# Patient Record
Sex: Male | Born: 1955 | Race: White | Hispanic: No | Marital: Single | Smoking: Current every day smoker
Health system: Southern US, Community
[De-identification: ages and names within clinical notes are randomized; demographics above are authoritative.]

## PROBLEM LIST (undated history)

## (undated) DIAGNOSIS — M1990 Unspecified osteoarthritis, unspecified site: Secondary | ICD-10-CM

## (undated) DIAGNOSIS — E785 Hyperlipidemia, unspecified: Secondary | ICD-10-CM

## (undated) DIAGNOSIS — R0600 Dyspnea, unspecified: Secondary | ICD-10-CM

## (undated) DIAGNOSIS — J449 Chronic obstructive pulmonary disease, unspecified: Secondary | ICD-10-CM

## (undated) DIAGNOSIS — R7303 Prediabetes: Secondary | ICD-10-CM

## (undated) DIAGNOSIS — J189 Pneumonia, unspecified organism: Secondary | ICD-10-CM

## (undated) HISTORY — PX: NO PAST SURGERIES: SHX2092

## (undated) HISTORY — PX: WISDOM TOOTH EXTRACTION: SHX21

---

## 2013-01-19 ENCOUNTER — Emergency Department: Payer: Self-pay | Admitting: Emergency Medicine

## 2015-03-16 ENCOUNTER — Other Ambulatory Visit: Payer: Self-pay

## 2015-03-23 ENCOUNTER — Other Ambulatory Visit: Payer: Self-pay

## 2015-03-23 ENCOUNTER — Ambulatory Visit: Payer: Self-pay

## 2015-03-28 ENCOUNTER — Other Ambulatory Visit: Payer: Self-pay

## 2015-03-30 ENCOUNTER — Other Ambulatory Visit: Payer: Self-pay

## 2015-03-30 ENCOUNTER — Ambulatory Visit: Payer: Self-pay

## 2015-04-06 ENCOUNTER — Ambulatory Visit: Payer: Self-pay

## 2015-04-06 DIAGNOSIS — E785 Hyperlipidemia, unspecified: Secondary | ICD-10-CM | POA: Insufficient documentation

## 2015-04-06 DIAGNOSIS — S63289A Dislocation of proximal interphalangeal joint of unspecified finger, initial encounter: Secondary | ICD-10-CM | POA: Insufficient documentation

## 2015-09-06 DIAGNOSIS — S63289A Dislocation of proximal interphalangeal joint of unspecified finger, initial encounter: Secondary | ICD-10-CM

## 2015-09-06 DIAGNOSIS — E785 Hyperlipidemia, unspecified: Secondary | ICD-10-CM

## 2015-10-05 ENCOUNTER — Ambulatory Visit: Payer: Self-pay

## 2015-10-17 ENCOUNTER — Ambulatory Visit: Payer: Self-pay | Admitting: Nurse Practitioner

## 2015-10-17 ENCOUNTER — Telehealth: Payer: Self-pay

## 2015-10-17 VITALS — BP 111/70 | HR 80 | Temp 98.7°F | Resp 16 | Ht 72.0 in | Wt 240.0 lb

## 2015-10-17 DIAGNOSIS — M545 Low back pain, unspecified: Secondary | ICD-10-CM | POA: Insufficient documentation

## 2015-10-17 DIAGNOSIS — Z8639 Personal history of other endocrine, nutritional and metabolic disease: Secondary | ICD-10-CM

## 2015-10-17 NOTE — Assessment & Plan Note (Signed)
Prescribed Naproxen 500 mg.

## 2015-10-17 NOTE — Telephone Encounter (Signed)
Mr called to verify appointment time. Confirmed appoint with mr for tonight, Tuesday , March 14 at 7pm

## 2015-10-17 NOTE — Progress Notes (Unsigned)
   Subjective:    Patient ID: Antonio Gonzalez, male    DOB: 1955/12/27, 60 y.o.   MRN: [id number]  HPI  Pt has a broken finger. Pt has already seen the orthopedist at [facility]. Pt was advised to get [facility] at [facility] but pt cannot afford to go down to [facility] and go through that process.   Pt complains of problem with upper R thigh. Pt claims that the area gets very hot and is slightly numb. 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   Pt has back pain. Takes aspirin PRN.    Review of Systems     Objective:   Physical Exam  Constitutional: He is oriented to person, place, and time. He appears well-developed.  HENT:  Head: Normocephalic.  Neck: Thyromegaly (mildly enlarged) present.  Cardiovascular: Normal rate, regular rhythm and normal heart sounds.   Pulmonary/Chest: Respiratory distress: diminsihed as expected in smoker.  Musculoskeletal: He exhibits no edema.  Lymphadenopathy:    He has no cervical adenopathy.  Neurological: He is alert and oriented to person, place, and time.  Dislocated finger at PIP joint     Assessment & Plan:  Low back pain Prescribed Naproxen 500 mg.    Labs ASAP: CBC with diff, CMP, TSH, A1c, lipids,  MD f/u: 2-3 week

## 2015-10-25 ENCOUNTER — Other Ambulatory Visit: Payer: Self-pay | Admitting: Family Medicine

## 2015-10-26 ENCOUNTER — Other Ambulatory Visit: Payer: Self-pay

## 2015-10-26 DIAGNOSIS — E7889 Other lipoprotein metabolism disorders: Secondary | ICD-10-CM

## 2015-10-26 DIAGNOSIS — E7800 Pure hypercholesterolemia, unspecified: Secondary | ICD-10-CM

## 2015-10-26 DIAGNOSIS — E119 Type 2 diabetes mellitus without complications: Secondary | ICD-10-CM

## 2015-10-26 MED ORDER — SIMVASTATIN 20 MG PO TABS: 20.0000 mg | ORAL_TABLET | Freq: Every evening | ORAL | Status: DC

## 2015-10-27 LAB — COMPREHENSIVE METABOLIC PANEL
ALK PHOS: 40 IU/L (ref 39–117)
ALT: 27 IU/L (ref 0–44)
AST: 21 IU/L (ref 0–40)
Albumin/Globulin Ratio: 2 (ref 1.2–2.2)
Albumin: 4.2 g/dL (ref 3.6–4.8)
BILIRUBIN TOTAL: 0.3 mg/dL (ref 0.0–1.2)
BUN / CREAT RATIO: 13 (ref 10–22)
BUN: 12 mg/dL (ref 8–27)
CHLORIDE: 98 mmol/L (ref 96–106)
CO2: 25 mmol/L (ref 18–29)
Calcium: 8.8 mg/dL (ref 8.6–10.2)
Creatinine, Ser: 0.89 mg/dL (ref 0.76–1.27)
GFR calc Af Amer: 107 mL/min/{1.73_m2} (ref >59)
GFR calc non Af Amer: 92 mL/min/{1.73_m2} (ref >59)
GLUCOSE: 89 mg/dL (ref 65–99)
Globulin, Total: 2.1 g/dL (ref 1.5–4.5)
Potassium: 4.4 mmol/L (ref 3.5–5.2)
Sodium: 140 mmol/L (ref 134–144)
Total Protein: 6.3 g/dL (ref 6.0–8.5)

## 2015-10-27 LAB — CBC WITH DIFFERENTIAL/PLATELET
BASOS ABS: 0.1 10*3/uL (ref 0.0–0.2)
Basos: 0 %
EOS (ABSOLUTE): 0.2 10*3/uL (ref 0.0–0.4)
Eos: 2 %
HEMOGLOBIN: 15.4 g/dL (ref 12.6–17.7)
Hematocrit: 45.8 % (ref 37.5–51.0)
Immature Grans (Abs): 0.1 10*3/uL (ref 0.0–0.1)
Immature Granulocytes: 1 %
LYMPHS ABS: 2.3 10*3/uL (ref 0.7–3.1)
LYMPHS: 21 %
MCH: 30.8 pg (ref 26.6–33.0)
MCHC: 33.6 g/dL (ref 31.5–35.7)
MCV: 92 fL (ref 79–97)
MONOCYTES: 8 %
Monocytes Absolute: 0.9 10*3/uL (ref 0.1–0.9)
Neutrophils Absolute: 7.7 10*3/uL — ABNORMAL HIGH (ref 1.4–7.0)
Neutrophils: 68 %
Platelets: 229 10*3/uL (ref 150–379)
RBC: 5 x10E6/uL (ref 4.14–5.80)
RDW: 14.6 % (ref 12.3–15.4)
WBC: 11.3 10*3/uL — AB (ref 3.4–10.8)

## 2015-10-27 LAB — LIPID PANEL
Chol/HDL Ratio: 3.3 ratio units (ref 0.0–5.0)
Cholesterol, Total: 198 mg/dL (ref 100–199)
HDL: 60 mg/dL (ref >39)
LDL Calculated: 113 mg/dL — ABNORMAL HIGH (ref 0–99)
TRIGLYCERIDES: 124 mg/dL (ref 0–149)
VLDL Cholesterol Cal: 25 mg/dL (ref 5–40)

## 2015-11-02 ENCOUNTER — Telehealth: Payer: Self-pay

## 2015-11-02 ENCOUNTER — Ambulatory Visit: Payer: Self-pay

## 2015-11-02 NOTE — Telephone Encounter (Signed)
Mr called to cancel his appointment tonight due to having flu mr resched app to [facility] April 6th

## 2015-11-09 ENCOUNTER — Ambulatory Visit: Payer: Self-pay | Admitting: Nurse Practitioner

## 2015-11-09 DIAGNOSIS — Z87898 Personal history of other specified conditions: Secondary | ICD-10-CM

## 2015-11-09 DIAGNOSIS — R05 Cough: Secondary | ICD-10-CM

## 2015-11-09 DIAGNOSIS — E785 Hyperlipidemia, unspecified: Secondary | ICD-10-CM

## 2015-11-09 DIAGNOSIS — R059 Cough, unspecified: Secondary | ICD-10-CM

## 2015-11-09 DIAGNOSIS — J438 Other emphysema: Secondary | ICD-10-CM

## 2015-11-09 DIAGNOSIS — D72829 Elevated white blood cell count, unspecified: Secondary | ICD-10-CM

## 2015-11-09 MED ORDER — AZITHROMYCIN 250 MG PO TABS: 500.0000 mg | ORAL_TABLET | Freq: Every day | ORAL | Status: AC

## 2015-11-09 NOTE — Progress Notes (Signed)
   Subjective:    Patient ID: Antonio Gonzalez, male    DOB: 1955/11/10, 60 y.o.   MRN: [id number]  HPI   Has had a productive cough over the last two weeks, now smoking < 1/2 ppd.  Has noted small amount of blood tinged sputum. States cough has interfered with his sleep.   Has an inhaler but has not understood when to use it.    Is deferring dental work, until other healthcare needs are met.    Wants prostate check, history of nocturia     Review of Systems  Constitutional: Negative for chills, diaphoresis and unexpected weight change.  HENT: Positive for congestion and dental problem.   Respiratory: Positive for cough and shortness of breath.   Cardiovascular: Negative for palpitations and leg swelling.  Endocrine: Negative for polyphagia.  Genitourinary: Positive for hematuria. Negative for urgency.  Musculoskeletal: Positive for back pain.  Psychiatric/Behavioral: Negative for hallucinations.       Objective:   Physical Exam  Constitutional: He is oriented to person, place, and time. He appears well-developed and well-nourished.  HENT:  Head: Normocephalic.  Severe dental disease  Eyes: Pupils are equal, round, and reactive to light.  Neck: No thyromegaly present.  Carotid bruits not present.  Cardiovascular: Normal rate and regular rhythm.   Pulmonary/Chest: Effort normal. He has wheezes.  Posteriorly, BBrS markedly decreased.    Musculoskeletal: He exhibits no edema.  Lymphadenopathy:    He has no cervical adenopathy.  Neurological: He is alert and oriented to person, place, and time.       Assessment & Plan:    Diagnoses and all orders for this visit:  Cough  Other emphysema ([place])  History of nocturia -     PSA; Future  Leucocytosis -     CBC w/Diff; Future  Elevated fasting lipid profile -     Comp Met (CMET); Future -     Lipid Profile; Future  Other orders -     azithromycin (ZITHROMAX) 250 MG tablet; Take 2 tablets (500 mg total) by mouth  daily.  concern re:  Bronchitis, and small amount of blood in sputum, will treat with ithromax and obtain a cxr

## 2016-01-09 ENCOUNTER — Telehealth: Payer: Self-pay | Admitting: Urology

## 2016-01-09 ENCOUNTER — Other Ambulatory Visit: Payer: Self-pay | Admitting: Urology

## 2016-01-09 MED ORDER — ALBUTEROL SULFATE HFA 108 (90 BASE) MCG/ACT IN AERS: 2.0000 | INHALATION_SPRAY | Freq: Four times a day (QID) | RESPIRATORY_TRACT | Status: DC | PRN

## 2016-01-09 NOTE — Telephone Encounter (Signed)
I sent a script in to [facility] for the inhaler.  He is to have a CXR at some point.

## 2016-01-09 NOTE — Telephone Encounter (Signed)
Patient called and stated that he is trying to get a refill on his Albuterol/sulfate inhaler but that [facility] said the RX had expired according to his records I do not see an RX for that.

## 2016-01-10 NOTE — Telephone Encounter (Signed)
Do you need me to order the Chest XRay?

## 2016-01-31 ENCOUNTER — Encounter (INDEPENDENT_AMBULATORY_CARE_PROVIDER_SITE_OTHER): Payer: Self-pay

## 2016-01-31 ENCOUNTER — Ambulatory Visit: Payer: Self-pay

## 2016-02-08 ENCOUNTER — Ambulatory Visit: Payer: Self-pay

## 2016-02-08 ENCOUNTER — Telehealth: Payer: Self-pay | Admitting: Nurse Practitioner

## 2016-02-08 NOTE — Telephone Encounter (Signed)
Minette Brine called patient rescheduled appt to 02/22/16.

## 2016-02-22 ENCOUNTER — Other Ambulatory Visit: Payer: Self-pay | Admitting: Urology

## 2016-02-22 ENCOUNTER — Ambulatory Visit: Payer: Self-pay | Admitting: Urology

## 2016-02-22 VITALS — BP 116/70 | HR 90 | Temp 98.2°F | Wt 237.0 lb

## 2016-02-22 DIAGNOSIS — J4489 Other specified chronic obstructive pulmonary disease: Secondary | ICD-10-CM

## 2016-02-22 DIAGNOSIS — E7889 Other lipoprotein metabolism disorders: Secondary | ICD-10-CM

## 2016-02-22 DIAGNOSIS — J449 Chronic obstructive pulmonary disease, unspecified: Secondary | ICD-10-CM

## 2016-02-22 MED ORDER — TIOTROPIUM BROMIDE MONOHYDRATE 18 MCG IN CAPS: 18.0000 ug | ORAL_CAPSULE | Freq: Once | RESPIRATORY_TRACT | Status: DC

## 2016-02-22 MED ORDER — SIMVASTATIN 20 MG PO TABS: 20.0000 mg | ORAL_TABLET | Freq: Every evening | ORAL | Status: DC

## 2016-02-22 MED ORDER — NAPROXEN 500 MG PO TABS: 500.0000 mg | ORAL_TABLET | Freq: Every day | ORAL | Status: DC

## 2016-02-22 MED ORDER — ALBUTEROL SULFATE HFA 108 (90 BASE) MCG/ACT IN AERS: 2.0000 | INHALATION_SPRAY | Freq: Four times a day (QID) | RESPIRATORY_TRACT | Status: DC | PRN

## 2016-02-22 NOTE — Telephone Encounter (Signed)
Patient needs to make a FU appointment before we will authorize anymore refills.  This will be the last refill until he sees a provider.

## 2016-02-22 NOTE — Progress Notes (Signed)
   Subjective:    Patient ID: Antonio Gonzalez, male    DOB: [birth year], 60 y.o.   MRN: [id number]  Cough Associated symptoms include shortness of breath. Pertinent negatives include no chills.  Hyperlipidemia Associated symptoms include shortness of breath.  Back Pain     Has been using the albuterol three times a week.  Has not had his chest x-ray.    Review of Systems  Constitutional: Negative for chills, diaphoresis and unexpected weight change.  HENT: Positive for congestion and dental problem.   Respiratory: Positive for cough and shortness of breath.   Cardiovascular: Negative for palpitations and leg swelling.  Endocrine: Negative for polyphagia.  Genitourinary: Positive for hematuria. Negative for urgency.  Musculoskeletal: Positive for back pain.  Psychiatric/Behavioral: Negative for hallucinations.       Objective:   Physical Exam  Constitutional: He is oriented to person, place, and time. He appears well-developed and well-nourished.  HENT:  Head: Normocephalic.  Severe dental disease  Eyes: Pupils are equal, round, and reactive to light.  Neck: No thyromegaly present.  Carotid bruits not present.  Cardiovascular: Normal rate and regular rhythm.   Pulmonary/Chest: Effort normal. He has wheezes.  Posteriorly, BBrS markedly decreased.    Musculoskeletal: He exhibits no edema.  Lymphadenopathy:    He has no cervical adenopathy.  Neurological: He is alert and oriented to person, place, and time.       Assessment & Plan:    Antonio Gonzalez was seen today for cough, hyperlipidemia and back pain.  Diagnoses and all orders for this visit:  Lipids abnormal -     simvastatin (ZOCOR) 20 MG tablet; Take 1 tablet (20 mg total) by mouth every evening.  COPD (chronic obstructive pulmonary disease) with chronic bronchitis ([place]) -     DG Chest 2 View; Future  Other orders -     albuterol (PROVENTIL HFA;VENTOLIN HFA) 108 (90 Base) MCG/ACT inhaler; Inhale 2 puffs into the lungs  every 6 (six) hours as needed for wheezing or shortness of breath. -     naproxen (NAPROSYN) 500 MG tablet; Take 1 tablet (500 mg total) by mouth at bedtime. Sometimes he will take two times a day. -     tiotropium (SPIRIVA HANDIHALER) 18 MCG inhalation capsule; Place 1 capsule (18 mcg total) into inhaler and inhale once.  concern re:  Bronchitis, and small amount of blood in sputum, will treat with ithromax and obtain a cxr

## 2016-02-23 NOTE — Telephone Encounter (Signed)
Refill request

## 2016-03-20 ENCOUNTER — Other Ambulatory Visit: Payer: Self-pay

## 2016-03-21 ENCOUNTER — Other Ambulatory Visit: Payer: Self-pay

## 2016-03-21 DIAGNOSIS — E785 Hyperlipidemia, unspecified: Secondary | ICD-10-CM

## 2016-03-21 DIAGNOSIS — Z87898 Personal history of other specified conditions: Secondary | ICD-10-CM

## 2016-03-21 DIAGNOSIS — D72829 Elevated white blood cell count, unspecified: Secondary | ICD-10-CM

## 2016-03-22 LAB — COMPREHENSIVE METABOLIC PANEL
ALBUMIN: 4.1 g/dL (ref 3.6–4.8)
ALK PHOS: 44 IU/L (ref 39–117)
ALT: 18 IU/L (ref 0–44)
AST: 17 IU/L (ref 0–40)
Albumin/Globulin Ratio: 2 (ref 1.2–2.2)
BUN / CREAT RATIO: 22 (ref 10–24)
BUN: 21 mg/dL (ref 8–27)
Bilirubin Total: <0.2 mg/dL (ref 0.0–1.2)
CALCIUM: 8.9 mg/dL (ref 8.6–10.2)
CO2: 23 mmol/L (ref 18–29)
CREATININE: 0.95 mg/dL (ref 0.76–1.27)
Chloride: 101 mmol/L (ref 96–106)
GFR, EST AFRICAN AMERICAN: 99 mL/min/{1.73_m2} (ref >59)
GFR, EST NON AFRICAN AMERICAN: 86 mL/min/{1.73_m2} (ref >59)
GLOBULIN, TOTAL: 2.1 g/dL (ref 1.5–4.5)
Glucose: 95 mg/dL (ref 65–99)
Potassium: 4.5 mmol/L (ref 3.5–5.2)
SODIUM: 141 mmol/L (ref 134–144)
TOTAL PROTEIN: 6.2 g/dL (ref 6.0–8.5)

## 2016-03-22 LAB — LIPID PANEL
CHOL/HDL RATIO: 3.4 ratio (ref 0.0–5.0)
Cholesterol, Total: 194 mg/dL (ref 100–199)
HDL: 57 mg/dL (ref >39)
LDL CALC: 86 mg/dL (ref 0–99)
Triglycerides: 254 mg/dL — ABNORMAL HIGH (ref 0–149)
VLDL CHOLESTEROL CAL: 51 mg/dL — AB (ref 5–40)

## 2016-03-22 LAB — CBC WITH DIFFERENTIAL/PLATELET
BASOS ABS: 0.1 10*3/uL (ref 0.0–0.2)
BASOS: 1 %
EOS (ABSOLUTE): 0.2 10*3/uL (ref 0.0–0.4)
Eos: 2 %
HEMATOCRIT: 45.4 % (ref 37.5–51.0)
HEMOGLOBIN: 15 g/dL (ref 12.6–17.7)
IMMATURE GRANS (ABS): 0 10*3/uL (ref 0.0–0.1)
IMMATURE GRANULOCYTES: 0 %
LYMPHS: 20 %
Lymphocytes Absolute: 2.1 10*3/uL (ref 0.7–3.1)
MCH: 30.4 pg (ref 26.6–33.0)
MCHC: 33 g/dL (ref 31.5–35.7)
MCV: 92 fL (ref 79–97)
MONOS ABS: 1.1 10*3/uL — AB (ref 0.1–0.9)
Monocytes: 10 %
NEUTROS ABS: 6.8 10*3/uL (ref 1.4–7.0)
NEUTROS PCT: 67 %
Platelets: 213 10*3/uL (ref 150–379)
RBC: 4.93 x10E6/uL (ref 4.14–5.80)
RDW: 14.4 % (ref 12.3–15.4)
WBC: 10.2 10*3/uL (ref 3.4–10.8)

## 2016-03-22 LAB — PSA: PROSTATE SPECIFIC AG, SERUM: 0.7 ng/mL (ref 0.0–4.0)

## 2016-03-26 ENCOUNTER — Other Ambulatory Visit: Payer: Self-pay

## 2016-03-27 ENCOUNTER — Encounter: Payer: Self-pay | Admitting: Internal Medicine

## 2016-03-27 ENCOUNTER — Ambulatory Visit: Payer: Self-pay | Admitting: Internal Medicine

## 2016-03-27 DIAGNOSIS — J449 Chronic obstructive pulmonary disease, unspecified: Secondary | ICD-10-CM | POA: Insufficient documentation

## 2016-03-27 MED ORDER — ALBUTEROL SULFATE HFA 108 (90 BASE) MCG/ACT IN AERS: 2.0000 | INHALATION_SPRAY | Freq: Four times a day (QID) | RESPIRATORY_TRACT | 2 refills | Status: DC | PRN

## 2016-03-27 NOTE — Patient Instructions (Signed)
F/u in 6 months w/ labs: Met C, CBC, Lipid, UA

## 2016-03-27 NOTE — Progress Notes (Signed)
   Subjective:    Patient ID: Antonio Gonzalez, male    DOB: 02/19/1956, 60 y.o.   MRN: [id number]  HPI  Pt. Presents w/ f/u for COPD. Reports he has decreased his smoking to 2 packs a week. Reports takes inhaler when he is wheezing.  History of elevated cholesterol. Pt. Reports irritation in left eye. Reports excessive tearing.   Patient Active Problem List   Diagnosis Date Noted  . Low back pain 10/17/2015  . Hyperlipidemia 04/06/2015  . Dislocation of finger PIP joint 04/06/2015     Medication List       Accurate as of 03/27/16 11:28 AM. Always use your most recent med list.          albuterol 108 (90 Base) MCG/ACT inhaler Commonly known as:  PROVENTIL HFA;VENTOLIN HFA Inhale 2 puffs into the lungs every 6 (six) hours as needed for wheezing or shortness of breath.   naproxen 500 MG tablet Commonly known as:  NAPROSYN Take 1 tablet (500 mg total) by mouth at bedtime. Sometimes he will take two times a day.   simvastatin 20 MG tablet Commonly known as:  ZOCOR Take 1 tablet (20 mg total) by mouth every evening.   tiotropium 18 MCG inhalation capsule Commonly known as:  SPIRIVA HANDIHALER Place 1 capsule (18 mcg total) into inhaler and inhale once.        Review of Systems   Left eye   Objective:   Physical Exam  Constitutional: He is oriented to person, place, and time.  Cardiovascular: Normal rate, regular rhythm and normal heart sounds.   Pulmonary/Chest: Effort normal and breath sounds normal.  Neurological: He is alert and oriented to person, place, and time.     BP 123/79   Pulse 74   Temp 98.3 F (36.8 C)   Wt 234 lb (106.1 kg)   BMI 31.74 kg/m       Assessment & Plan:   F/u in 6 months w/ labs: Met C, CBC, Lipid, UA

## 2016-06-20 ENCOUNTER — Other Ambulatory Visit: Payer: Self-pay

## 2016-06-20 DIAGNOSIS — J449 Chronic obstructive pulmonary disease, unspecified: Secondary | ICD-10-CM

## 2016-06-20 MED ORDER — ALBUTEROL SULFATE HFA 108 (90 BASE) MCG/ACT IN AERS: 2.0000 | INHALATION_SPRAY | Freq: Four times a day (QID) | RESPIRATORY_TRACT | 2 refills | Status: DC | PRN

## 2016-07-02 ENCOUNTER — Telehealth: Payer: Self-pay | Admitting: Urology

## 2016-07-02 NOTE — Telephone Encounter (Signed)
Went to med. Management but RX not there. Rx was brought here and he needs it sent back. Return Antonio Gonzalez.

## 2016-07-03 ENCOUNTER — Telehealth: Payer: Self-pay

## 2016-07-03 NOTE — Telephone Encounter (Signed)
Called pt back about RX that he has not received from medication management. Left message.

## 2016-07-03 NOTE — Telephone Encounter (Signed)
Called pt back and informed him that the inhaler requires a PAP which was signed yesterday and waiting to be picked up. Also explained to pt that it can take 6-8 weeks for the medication to come in. Pt verbalized understanding.

## 2016-09-12 ENCOUNTER — Other Ambulatory Visit: Payer: Self-pay

## 2016-09-17 ENCOUNTER — Other Ambulatory Visit: Payer: Self-pay

## 2016-09-19 ENCOUNTER — Ambulatory Visit: Payer: Self-pay

## 2016-09-19 ENCOUNTER — Other Ambulatory Visit: Payer: Self-pay

## 2016-09-19 DIAGNOSIS — J449 Chronic obstructive pulmonary disease, unspecified: Secondary | ICD-10-CM

## 2016-09-20 LAB — COMPREHENSIVE METABOLIC PANEL
ALBUMIN: 4.3 g/dL (ref 3.6–4.8)
ALK PHOS: 48 IU/L (ref 39–117)
ALT: 23 IU/L (ref 0–44)
AST: 22 IU/L (ref 0–40)
Albumin/Globulin Ratio: 2.4 — ABNORMAL HIGH (ref 1.2–2.2)
BUN / CREAT RATIO: 12 (ref 10–24)
BUN: 12 mg/dL (ref 8–27)
Bilirubin Total: <0.2 mg/dL (ref 0.0–1.2)
CO2: 26 mmol/L (ref 18–29)
CREATININE: 1.03 mg/dL (ref 0.76–1.27)
Calcium: 8.9 mg/dL (ref 8.6–10.2)
Chloride: 100 mmol/L (ref 96–106)
GFR calc Af Amer: 90 mL/min/{1.73_m2} (ref >59)
GFR calc non Af Amer: 77 mL/min/{1.73_m2} (ref >59)
GLUCOSE: 96 mg/dL (ref 65–99)
Globulin, Total: 1.8 g/dL (ref 1.5–4.5)
Potassium: 4.5 mmol/L (ref 3.5–5.2)
Sodium: 141 mmol/L (ref 134–144)
Total Protein: 6.1 g/dL (ref 6.0–8.5)

## 2016-09-20 LAB — URINALYSIS
Bilirubin, UA: NEGATIVE
Glucose, UA: NEGATIVE
KETONES UA: NEGATIVE
LEUKOCYTES UA: NEGATIVE
NITRITE UA: NEGATIVE
PROTEIN UA: NEGATIVE
RBC UA: NEGATIVE
SPEC GRAV UA: 1.019 (ref 1.005–1.030)
Urobilinogen, Ur: 0.2 mg/dL (ref 0.2–1.0)
pH, UA: 5 (ref 5.0–7.5)

## 2016-09-20 LAB — CBC
HEMATOCRIT: 45.3 % (ref 37.5–51.0)
HEMOGLOBIN: 14.7 g/dL (ref 13.0–17.7)
MCH: 29.7 pg (ref 26.6–33.0)
MCHC: 32.5 g/dL (ref 31.5–35.7)
MCV: 92 fL (ref 79–97)
Platelets: 252 10*3/uL (ref 150–379)
RBC: 4.95 x10E6/uL (ref 4.14–5.80)
RDW: 14.1 % (ref 12.3–15.4)
WBC: 11.6 10*3/uL — AB (ref 3.4–10.8)

## 2016-09-20 LAB — LIPID PANEL
CHOL/HDL RATIO: 3.5 ratio (ref 0.0–5.0)
Cholesterol, Total: 202 mg/dL — ABNORMAL HIGH (ref 100–199)
HDL: 58 mg/dL (ref >39)
LDL CALC: 96 mg/dL (ref 0–99)
TRIGLYCERIDES: 242 mg/dL — AB (ref 0–149)
VLDL CHOLESTEROL CAL: 48 mg/dL — AB (ref 5–40)

## 2017-01-02 ENCOUNTER — Telehealth: Payer: Self-pay | Admitting: Pharmacist

## 2017-01-02 ENCOUNTER — Telehealth: Payer: Self-pay

## 2017-01-02 NOTE — Telephone Encounter (Signed)
01/02/17 Faxed script to [place] for refill-Ventolin HFA 90 mcg Inject 2 puffs into the lungs every 6 hours as needed for wheezing or shortness of breath.

## 2017-01-02 NOTE — Telephone Encounter (Signed)
Received PAP application from [facility] for Ventolin placed for provider to sign. 

## 2017-01-02 NOTE — Telephone Encounter (Signed)
Placed signed application/script in [facility] folder for pickup. 

## 2017-03-06 ENCOUNTER — Other Ambulatory Visit: Payer: Self-pay | Admitting: Urology

## 2017-03-06 ENCOUNTER — Other Ambulatory Visit: Payer: Self-pay | Admitting: Internal Medicine

## 2017-03-06 ENCOUNTER — Other Ambulatory Visit: Payer: Self-pay | Admitting: Adult Health Nurse Practitioner

## 2017-03-06 DIAGNOSIS — E7889 Other lipoprotein metabolism disorders: Secondary | ICD-10-CM

## 2017-03-06 MED ORDER — NAPROXEN 500 MG PO TABS: 500.0000 mg | ORAL_TABLET | Freq: Every day | ORAL | 2 refills | Status: DC

## 2017-03-06 MED ORDER — TIOTROPIUM BROMIDE MONOHYDRATE 18 MCG IN CAPS: 18.0000 ug | ORAL_CAPSULE | Freq: Once | RESPIRATORY_TRACT | 12 refills | Status: DC

## 2017-03-06 MED ORDER — SIMVASTATIN 20 MG PO TABS: 20.0000 mg | ORAL_TABLET | Freq: Every evening | ORAL | 1 refills | Status: DC

## 2017-03-07 ENCOUNTER — Telehealth: Payer: Self-pay | Admitting: Pharmacist

## 2017-03-07 NOTE — Telephone Encounter (Signed)
03/07/17 Patient eligible till Feb 2019.Antonio Gonzalez

## 2017-04-01 ENCOUNTER — Telehealth: Payer: Self-pay

## 2017-04-01 NOTE — Telephone Encounter (Signed)
Received PAP application from [facility] for Spiriva placed for provider to sign. 

## 2017-04-01 NOTE — Telephone Encounter (Signed)
Placed signed application/script in [facility] folder for pickup. 

## 2017-04-01 NOTE — Telephone Encounter (Signed)
Application for Ventolin as well

## 2017-04-08 ENCOUNTER — Telehealth: Payer: Self-pay | Admitting: Pharmacist

## 2017-04-08 NOTE — Telephone Encounter (Signed)
04/08/17 Faxed [facility] Enrollment application for Ventolin HFA 48mcg Inhale 2 puffs into the lungs every 6 hours as needed for wheezing or shortness of breath.AJ  Spiriva 04/08/17 Sending script to [facility] for provider to sign.Delos Haring

## 2017-05-05 ENCOUNTER — Telehealth: Payer: Self-pay | Admitting: Pharmacist

## 2017-05-05 NOTE — Telephone Encounter (Signed)
05/05/17 [facility] application for enrolled for Spiriva 18 mcg Inhale the contents of 1 capsule daily using handihaler.Antonio Gonzalez

## 2017-07-07 ENCOUNTER — Telehealth: Payer: Self-pay | Admitting: Pharmacist

## 2017-07-07 NOTE — Telephone Encounter (Signed)
07/07/17 Called [facility] for refill order# [id number] to patient's home, allow 7-10 business days to receive. This will leave patient 2 refill.Antonio Gonzalez

## 2017-08-07 ENCOUNTER — Telehealth: Payer: Self-pay | Admitting: Pharmacist

## 2017-08-07 NOTE — Telephone Encounter (Signed)
08/07/17 Called [facility] for refill on Spiriva 90mcg, they will ship to patient on 09/05/17, allow 7-10 business days from 09/05/17 for patient to receive, order# [id number], 1 refill left.Delos Haring

## 2017-09-03 ENCOUNTER — Telehealth: Payer: Self-pay | Admitting: Pharmacist

## 2017-09-03 NOTE — Telephone Encounter (Signed)
09/03/17 Placed refill online with [facility] for Ventolin HFA, will release 09/15/17, order# [id number].Delos Haring

## 2017-10-10 ENCOUNTER — Telehealth: Payer: Self-pay | Admitting: Pharmacy Technician

## 2017-10-10 NOTE — Telephone Encounter (Signed)
Patient failed to provide 2019 financial documentation.  No additional medication assistance will be provided by [facility] without the required proof of income documentation.  Patient notified by letter.  Betty J. Kluttz Care Manager [facility] 

## 2017-10-29 ENCOUNTER — Other Ambulatory Visit: Payer: Self-pay | Admitting: Adult Health Nurse Practitioner

## 2017-10-29 DIAGNOSIS — E7889 Other lipoprotein metabolism disorders: Secondary | ICD-10-CM

## 2017-10-31 ENCOUNTER — Telehealth: Payer: Self-pay | Admitting: Pharmacist

## 2017-10-31 ENCOUNTER — Telehealth: Payer: Self-pay | Admitting: Pharmacy Technician

## 2017-10-31 NOTE — Telephone Encounter (Signed)
Received updated proof of income.  Patient eligible to receive medication assistance at [facility] through 2019, as long as eligibility requirements continue to be met.  [facility]

## 2017-10-31 NOTE — Telephone Encounter (Signed)
10/31/2017 12:19:10 PM - Spiriva refill  10/31/17 Called [facility] for refill on Spiriva 13mcg, they will ship 11/01/17 to patient's home, order# [id number], allow 7-10 business days from 11/01/17 for patient to receive.Antonio Gonzalez

## 2017-11-07 ENCOUNTER — Other Ambulatory Visit: Payer: Self-pay | Admitting: Adult Health Nurse Practitioner

## 2017-11-07 DIAGNOSIS — E7889 Other lipoprotein metabolism disorders: Secondary | ICD-10-CM

## 2017-12-01 ENCOUNTER — Telehealth: Payer: Self-pay | Admitting: Pharmacist

## 2017-12-01 NOTE — Telephone Encounter (Signed)
12/01/2017 10:58:54 AM - Ventolin HFA refill  12/01/17 Placed refill online with [facility] for Ventolin HFA, to release 12/12/17, order# [id number].Delos Haring

## 2017-12-04 ENCOUNTER — Telehealth: Payer: Self-pay

## 2017-12-04 NOTE — Telephone Encounter (Signed)
Created in error

## 2017-12-09 ENCOUNTER — Telehealth: Payer: Self-pay | Admitting: Adult Health Nurse Practitioner

## 2017-12-09 NOTE — Telephone Encounter (Signed)
Called to cancel 5/19 12:30 apt and wanted to reschedule for following week. I believe that he needs to update his application before being seen. Did not [place] back to reschedule apt.

## 2017-12-11 ENCOUNTER — Ambulatory Visit: Payer: Self-pay | Admitting: Adult Health

## 2017-12-18 ENCOUNTER — Ambulatory Visit: Payer: Self-pay | Admitting: Adult Health

## 2017-12-18 VITALS — BP 112/70 | HR 92 | Temp 98.6°F | Ht 72.0 in | Wt 244.4 lb

## 2017-12-22 NOTE — Progress Notes (Signed)
Encounter cancelled  °

## 2017-12-24 ENCOUNTER — Ambulatory Visit: Payer: Self-pay | Admitting: Internal Medicine

## 2017-12-25 ENCOUNTER — Ambulatory Visit: Payer: Self-pay | Admitting: Adult Health

## 2018-01-07 ENCOUNTER — Ambulatory Visit: Payer: Self-pay | Admitting: Internal Medicine

## 2018-01-28 ENCOUNTER — Telehealth: Payer: Self-pay | Admitting: Pharmacist

## 2018-01-28 NOTE — Telephone Encounter (Signed)
01/28/2018 3:30:41 PM - Spiriva renewal  01/28/18 Patient in office today, brought in medication insert, not invoice for Spiriva stated he received about 90 days ago. I reviewed patient notes and that was his last refill. Printed new application for renewal-got patient to sign today, will send to [facility] for provider to sign, will then send to [facility]. They will ship to patient, explained to patient when he receives any medication to bring invoice to [place] so that we can keep up with when he receives, when we need to reorder that way we can catch when enrollment ending for renewal.AJ

## 2018-02-25 ENCOUNTER — Telehealth: Payer: Self-pay | Admitting: Pharmacist

## 2018-02-25 NOTE — Telephone Encounter (Signed)
02/25/2018 12:12:16 PM - Ventolin/[employer] renewal  02/25/18 Received notice from [facility] that patient enrollment expires 04/18/18- we will need to reorder patient's Ventolin next month-printed [employer] renewal application-mailing patient his portion to sign & return, also taking script to [facility] for Ventolin HFA 15mcg Inhale 2 puffs into the lungs every 6 hours as needed for wheezing or shortness of breath.Delos Haring

## 2018-03-02 ENCOUNTER — Other Ambulatory Visit: Payer: Self-pay | Admitting: Adult Health Nurse Practitioner

## 2018-03-02 DIAGNOSIS — E7889 Other lipoprotein metabolism disorders: Secondary | ICD-10-CM

## 2018-03-20 ENCOUNTER — Telehealth: Payer: Self-pay | Admitting: Pharmacist

## 2018-03-20 NOTE — Telephone Encounter (Signed)
03/20/2018 11:28:55 AM - Ventolin renewal to [place]  03/20/18 Faxed [employer] renewal application with script for Ventolin HFA 7mcg Inhale 2 puffs into the lungs every 6 hours as needed for wheezing or shortness of breath.Delos Haring

## 2018-04-02 ENCOUNTER — Ambulatory Visit: Payer: Self-pay | Admitting: Adult Health

## 2018-05-05 ENCOUNTER — Ambulatory Visit: Payer: Self-pay | Admitting: Urology

## 2018-05-05 VITALS — BP 120/78 | HR 92 | Temp 97.9°F | Ht 72.0 in | Wt 233.3 lb

## 2018-05-05 DIAGNOSIS — E7889 Other lipoprotein metabolism disorders: Secondary | ICD-10-CM

## 2018-05-05 DIAGNOSIS — J449 Chronic obstructive pulmonary disease, unspecified: Secondary | ICD-10-CM

## 2018-05-05 DIAGNOSIS — J189 Pneumonia, unspecified organism: Secondary | ICD-10-CM

## 2018-05-05 MED ORDER — ALBUTEROL SULFATE HFA 108 (90 BASE) MCG/ACT IN AERS: 2.0000 | INHALATION_SPRAY | Freq: Four times a day (QID) | RESPIRATORY_TRACT | 2 refills | Status: AC | PRN

## 2018-05-05 MED ORDER — TIOTROPIUM BROMIDE MONOHYDRATE 18 MCG IN CAPS: 18.0000 ug | ORAL_CAPSULE | Freq: Once | RESPIRATORY_TRACT | 0 refills | Status: DC

## 2018-05-05 MED ORDER — NAPROXEN 500 MG PO TABS: 500.0000 mg | ORAL_TABLET | Freq: Every day | ORAL | 0 refills | Status: DC

## 2018-05-05 MED ORDER — SIMVASTATIN 20 MG PO TABS: 20.0000 mg | ORAL_TABLET | Freq: Every evening | ORAL | 0 refills | Status: DC

## 2018-05-05 MED ORDER — FLUTICASONE-SALMETEROL 250-50 MCG/DOSE IN AEPB: 1.0000 | INHALATION_SPRAY | Freq: Two times a day (BID) | RESPIRATORY_TRACT | 3 refills | Status: DC

## 2018-05-05 NOTE — Progress Notes (Signed)
  Patient: Antonio Gonzalez Male    DOB: 18-Jul-1956   62 y.o.   MRN: [id number] Visit Date: 05/05/2018  Today's Provider: [place]   Chief Complaint  Patient presents with  . Follow-up    pneumonia   Subjective:    HPI Patient was seen and treated for pneumonia at [place] better today.  Has completed antibiotics.  Needs Advair.    Fasted today and would like his BS checked.  POCT glucose 70.    Had an episode of left hip pain yesterday that was so intense, he had difficulty walking.  Feeling better today.   No Known Allergies Previous Medications   ALBUTEROL (PROVENTIL HFA;VENTOLIN HFA) 108 (90 BASE) MCG/ACT INHALER    Inhale 2 puffs into the lungs every 6 (six) hours as needed for wheezing or shortness of breath.   NAPROXEN (NAPROSYN) 500 MG TABLET    TAKE 1 TO 2 TABLETS BY MOUTH AT BEDTIME   SIMVASTATIN (ZOCOR) 20 MG TABLET    TAKE ONE TABLET BY MOUTH EVERY EVENING   TIOTROPIUM (SPIRIVA HANDIHALER) 18 MCG INHALATION CAPSULE    Place 1 capsule (18 mcg total) into inhaler and inhale once.    Review of Systems  Constitutional: Positive for activity change.  HENT: Positive for congestion.   Eyes: Negative.   Respiratory: Positive for cough.   Cardiovascular: Negative.   Gastrointestinal: Negative.   Endocrine: Negative.   Genitourinary: Negative.   Musculoskeletal: Negative.   Allergic/Immunologic: Negative.   Neurological: Negative.   Hematological: Negative.   Psychiatric/Behavioral: Negative.     Social History   Tobacco Use  . Smoking status: Current Every Day Smoker    Packs/day: 0.25    Years: 40.00    Pack years: 10.00  . Smokeless tobacco: Current User  Substance Use Topics  . Alcohol use: Yes    Comment: seldom   Objective:   BP 120/78 (BP Location: Left Arm, Patient Position: Sitting)   Pulse 92   Temp 97.9 F (36.6 C)   Ht 6' (1.829 m)   Wt 233 lb 4.8 oz (105.8 kg)   BMI 31.64 kg/m   Physical Exam Constitutional: Well  nourished. Alert and oriented, No acute distress. HEENT: [place] AT, moist mucus membranes. Trachea midline, no masses. Cardiovascular: No clubbing, cyanosis, or edema. Respiratory: Normal respiratory effort, no increased work of breathing. GI: Abdomen is soft, non tender, non distended, no abdominal masses.  GU: No CVA tenderness.  No bladder fullness or masses.   Skin: No rashes, bruises or suspicious lesions. Lymph: No cervical or inguinal adenopathy. Neurologic: Grossly intact, no focal deficits, moving all 4 extremities. Psychiatric: Normal mood and affect.      Assessment & Plan:     1. Pneumonia  Given Advair sample  Recheck in two weeks  2. Back pain  Offered back x-rays - he declined   Reassess in two weeks          [facility]

## 2018-05-14 ENCOUNTER — Other Ambulatory Visit: Payer: Self-pay | Admitting: Urology

## 2018-05-14 ENCOUNTER — Telehealth: Payer: Self-pay | Admitting: Pharmacist

## 2018-05-14 MED ORDER — CEFDINIR 300 MG PO CAPS: 300.0000 mg | ORAL_CAPSULE | Freq: Two times a day (BID) | ORAL | 0 refills | Status: DC

## 2018-05-14 NOTE — Telephone Encounter (Signed)
05/14/2018 12:02:01 PM - Ventolin HFA refill  05/14/18 Placed refill with [facility] online for Ventolin HFA, to ship 06/24/18, order# [id number].Delos Haring

## 2018-06-26 ENCOUNTER — Telehealth: Payer: Self-pay | Admitting: Pharmacist

## 2018-06-26 NOTE — Telephone Encounter (Signed)
06/26/2018 2:26:07 PM - Advair 250/50  06/26/18 Received pharmacy printout for Advair 250/50 Inhale 1 puff into the lungs two times a day, printed script to send to [facility] for provider to sign. Patient is already enrolled with [facility]. Patient # [id number]. (note on printout Not originally sent to [facility]).Delos Haring

## 2018-07-10 ENCOUNTER — Telehealth: Payer: Self-pay | Admitting: Pharmacist

## 2018-07-10 NOTE — Telephone Encounter (Signed)
07/10/2018 10:28:38 AM - Advair 250/50  07/10/18 Faxed script to [place] for Advair 250/50 Inhale 1 puff into the lungs two times a day.Delos Haring

## 2018-07-16 ENCOUNTER — Emergency Department: Payer: Self-pay

## 2018-07-16 ENCOUNTER — Inpatient Hospital Stay
Admission: EM | Admit: 2018-07-16 | Discharge: 2018-07-18 | DRG: 190 | Disposition: A | Discharge status: Home / Self Care | Payer: Self-pay | Attending: Internal Medicine | Admitting: Internal Medicine

## 2018-07-16 ENCOUNTER — Other Ambulatory Visit: Payer: Self-pay

## 2018-07-16 DIAGNOSIS — J441 Chronic obstructive pulmonary disease with (acute) exacerbation: Principal | ICD-10-CM | POA: Diagnosis present

## 2018-07-16 DIAGNOSIS — Z833 Family history of diabetes mellitus: Secondary | ICD-10-CM

## 2018-07-16 DIAGNOSIS — Z79899 Other long term (current) drug therapy: Secondary | ICD-10-CM

## 2018-07-16 DIAGNOSIS — J440 Chronic obstructive pulmonary disease with acute lower respiratory infection: Secondary | ICD-10-CM | POA: Diagnosis present

## 2018-07-16 DIAGNOSIS — E162 Hypoglycemia, unspecified: Secondary | ICD-10-CM | POA: Diagnosis present

## 2018-07-16 DIAGNOSIS — J9601 Acute respiratory failure with hypoxia: Secondary | ICD-10-CM | POA: Diagnosis present

## 2018-07-16 DIAGNOSIS — J189 Pneumonia, unspecified organism: Secondary | ICD-10-CM | POA: Diagnosis present

## 2018-07-16 DIAGNOSIS — K5900 Constipation, unspecified: Secondary | ICD-10-CM | POA: Diagnosis present

## 2018-07-16 DIAGNOSIS — Y95 Nosocomial condition: Secondary | ICD-10-CM | POA: Diagnosis present

## 2018-07-16 DIAGNOSIS — R05 Cough: Secondary | ICD-10-CM

## 2018-07-16 DIAGNOSIS — R059 Cough, unspecified: Secondary | ICD-10-CM

## 2018-07-16 DIAGNOSIS — E785 Hyperlipidemia, unspecified: Secondary | ICD-10-CM | POA: Diagnosis present

## 2018-07-16 DIAGNOSIS — F17200 Nicotine dependence, unspecified, uncomplicated: Secondary | ICD-10-CM | POA: Diagnosis present

## 2018-07-16 DIAGNOSIS — Z791 Long term (current) use of non-steroidal anti-inflammatories (NSAID): Secondary | ICD-10-CM

## 2018-07-16 HISTORY — DX: Chronic obstructive pulmonary disease, unspecified: J44.9

## 2018-07-16 HISTORY — DX: Hyperlipidemia, unspecified: E78.5

## 2018-07-16 LAB — CBC WITH DIFFERENTIAL/PLATELET
Abs Immature Granulocytes: 0.21 10*3/uL — ABNORMAL HIGH (ref 0.00–0.07)
Basophils Absolute: 0.1 10*3/uL (ref 0.0–0.1)
Basophils Relative: 1 %
Eosinophils Absolute: 0.1 10*3/uL (ref 0.0–0.5)
Eosinophils Relative: 0 %
HCT: 46.2 % (ref 39.0–52.0)
Hemoglobin: 15 g/dL (ref 13.0–17.0)
Immature Granulocytes: 1 %
Lymphocytes Relative: 2 %
Lymphs Abs: 0.4 10*3/uL — ABNORMAL LOW (ref 0.7–4.0)
MCH: 30.5 pg (ref 26.0–34.0)
MCHC: 32.5 g/dL (ref 30.0–36.0)
MCV: 93.9 fL (ref 80.0–100.0)
MONO ABS: 1.5 10*3/uL — AB (ref 0.1–1.0)
Monocytes Relative: 7 %
NRBC: 0 % (ref 0.0–0.2)
Neutro Abs: 19.4 10*3/uL — ABNORMAL HIGH (ref 1.7–7.7)
Neutrophils Relative %: 89 %
Platelets: 273 10*3/uL (ref 150–400)
RBC: 4.92 MIL/uL (ref 4.22–5.81)
RDW: 13.4 % (ref 11.5–15.5)
SMEAR REVIEW: NORMAL
WBC: 21.7 10*3/uL — AB (ref 4.0–10.5)

## 2018-07-16 LAB — URINALYSIS, COMPLETE (UACMP) WITH MICROSCOPIC
BACTERIA UA: NONE SEEN
Bilirubin Urine: NEGATIVE
Ketones, ur: 5 mg/dL — AB
Leukocytes, UA: NEGATIVE
Nitrite: NEGATIVE
Protein, ur: NEGATIVE mg/dL
Specific Gravity, Urine: 1.003 — ABNORMAL LOW (ref 1.005–1.030)
Squamous Epithelial / HPF: NONE SEEN (ref 0–5)
pH: 6 (ref 5.0–8.0)

## 2018-07-16 LAB — BASIC METABOLIC PANEL
ANION GAP: 12 (ref 5–15)
BUN: 13 mg/dL (ref 8–23)
CALCIUM: 8.1 mg/dL — AB (ref 8.9–10.3)
CHLORIDE: 98 mmol/L (ref 98–111)
CO2: 20 mmol/L — ABNORMAL LOW (ref 22–32)
Creatinine, Ser: 1.13 mg/dL (ref 0.61–1.24)
GFR calc Af Amer: >60 mL/min (ref >60)
GFR calc non Af Amer: >60 mL/min (ref >60)
Glucose, Bld: <20 mg/dL — CL (ref 70–99)
Potassium: 3.8 mmol/L (ref 3.5–5.1)
Sodium: 130 mmol/L — ABNORMAL LOW (ref 135–145)

## 2018-07-16 LAB — BLOOD GAS, VENOUS
Acid-base deficit: 0.2 mmol/L (ref 0.0–2.0)
Bicarbonate: 25.4 mmol/L (ref 20.0–28.0)
O2 Saturation: 93.3 %
Patient temperature: 37
pCO2, Ven: 44 mmHg (ref 44.0–60.0)
pH, Ven: 7.37 (ref 7.250–7.430)
pO2, Ven: 70 mmHg — ABNORMAL HIGH (ref 32.0–45.0)

## 2018-07-16 LAB — TROPONIN I: Troponin I: <0.03 ng/mL (ref <0.03)

## 2018-07-16 LAB — GLUCOSE, CAPILLARY
Glucose-Capillary: 240 mg/dL — ABNORMAL HIGH (ref 70–99)
Glucose-Capillary: 269 mg/dL — ABNORMAL HIGH (ref 70–99)

## 2018-07-16 LAB — BRAIN NATRIURETIC PEPTIDE: B NATRIURETIC PEPTIDE 5: 164 pg/mL — AB (ref 0.0–100.0)

## 2018-07-16 MED ORDER — LEVOFLOXACIN IN D5W 750 MG/150ML IV SOLN
750.0000 mg | INTRAVENOUS | Status: DC
  Filled 2018-07-16: qty 150

## 2018-07-16 MED ORDER — LEVOFLOXACIN IN D5W 750 MG/150ML IV SOLN
750.0000 mg | Freq: Once | INTRAVENOUS | Status: AC
  Administered 2018-07-16: 750 mg via INTRAVENOUS
  Filled 2018-07-16: qty 150

## 2018-07-16 MED ORDER — DEXTROSE 50 % IV SOLN
INTRAVENOUS | Status: AC
  Administered 2018-07-16: 50 mL
  Filled 2018-07-16: qty 50

## 2018-07-16 MED ORDER — SODIUM CHLORIDE 0.9 % IV SOLN
2.0000 g | Freq: Three times a day (TID) | INTRAVENOUS | Status: DC
  Administered 2018-07-17 (×4): 2 g via INTRAVENOUS
  Filled 2018-07-16 (×7): qty 2

## 2018-07-16 MED ORDER — VANCOMYCIN HCL 10 G IV SOLR
1500.0000 mg | Freq: Once | INTRAVENOUS | Status: AC
  Administered 2018-07-17: 1500 mg via INTRAVENOUS
  Filled 2018-07-16: qty 1500

## 2018-07-16 NOTE — H&P (Signed)
[place] [facility] [place] NAME: Antonio Gonzalez    MR#:  [id number]  DATE OF BIRTH:  [birth year]  DATE OF ADMISSION:  07/16/2018  PRIMARY CARE PHYSICIAN: Boyce Medici, FNP   REQUESTING/REFERRING PHYSICIAN: Jimmye Norman, MD  CHIEF COMPLAINT:   Chief Complaint  Patient presents with  . Shortness of Breath    HISTORY OF PRESENT ILLNESS:  Antonio Gonzalez  is a 62 y.o. male who presents with chief complaint as above.  Patient presents with persistent shortness of breath.  He states that he was treated about 2 months ago in a different hospital for pneumonia.  He went home and finished his antibiotics, but did not feel a little better and has had 2 subsequent courses of antibiotics prior to presentation to the ED tonight.  He states that he has had some posterior thoracic pain, especially with deep breathing.  Initial work-up here in the ED did not show significant pneumonia on chest x-ray, but he does have a leukocytosis and has had cough with sputum production.  Patient also has COPD.  He was hypoglycemic on presentation to the ED tonight with a glucose level of less than 20.  He received D50 and this came up to 240.  Hospitalist were called for admission  PAST MEDICAL HISTORY:   Past Medical History:  Diagnosis Date  . COPD (chronic obstructive pulmonary disease) ([place])   . HLD (hyperlipidemia)      PAST SURGICAL HISTORY:   Past Surgical History:  Procedure Laterality Date  . NO PAST SURGERIES       SOCIAL HISTORY:   Social History   Tobacco Use  . Smoking status: Current Every Day Smoker    Packs/day: 0.25    Years: 40.00    Pack years: 10.00  . Smokeless tobacco: Current User  Substance Use Topics  . Alcohol use: Yes    Comment: seldom     FAMILY HISTORY:   Family History  Problem Relation Age of Onset  . Diabetes type II Mother   . Diabetes type II Maternal Grandmother      DRUG ALLERGIES:  No Known  Allergies  MEDICATIONS AT HOME:   Prior to Admission medications   Medication Sig Start Date End Date Taking? Authorizing Provider  albuterol (PROVENTIL HFA;VENTOLIN HFA) 108 (90 Base) MCG/ACT inhaler Inhale 2 puffs into the lungs every 6 (six) hours as needed for wheezing or shortness of breath. 05/05/18   Zara Council A, PA-C  cefdinir (OMNICEF) 300 MG capsule Take 1 capsule (300 mg total) by mouth 2 (two) times daily. 05/14/18   Zara Council A, PA-C  Fluticasone-Salmeterol (ADVAIR DISKUS) 250-50 MCG/DOSE AEPB Inhale 1 puff into the lungs 2 (two) times daily. 05/05/18   Zara Council A, PA-C  naproxen (NAPROSYN) 500 MG tablet Take 1-2 tablets (500-1,000 mg total) by mouth at bedtime. 05/05/18   Zara Council A, PA-C  simvastatin (ZOCOR) 20 MG tablet Take 1 tablet (20 mg total) by mouth every evening. 05/05/18   Zara Council A, PA-C  tiotropium (SPIRIVA HANDIHALER) 18 MCG inhalation capsule Place 1 capsule (18 mcg total) into inhaler and inhale once for 1 dose. 05/05/18 05/05/18  Zara Council A, PA-C    REVIEW OF SYSTEMS:  Review of Systems  Constitutional: Negative for chills, fever, malaise/fatigue and weight loss.  HENT: Negative for ear pain, hearing loss and tinnitus.   Eyes: Negative for blurred vision, double vision, pain and redness.  Respiratory: Positive for cough and  shortness of breath. Negative for hemoptysis.   Cardiovascular: Negative for chest pain, palpitations, orthopnea and leg swelling.  Gastrointestinal: Negative for abdominal pain, constipation, diarrhea, nausea and vomiting.  Genitourinary: Negative for dysuria, frequency and hematuria.  Musculoskeletal: Negative for back pain, joint pain and neck pain.  Skin:       No acne, rash, or lesions  Neurological: Negative for dizziness, tremors, focal weakness and weakness.  Endo/Heme/Allergies: Negative for polydipsia. Does not bruise/bleed easily.  Psychiatric/Behavioral: Negative for depression. The  patient is not nervous/anxious and does not have insomnia.      VITAL SIGNS:   Vitals:   07/16/18 2018 07/16/18 2031 07/16/18 2130 07/16/18 2200  BP:  113/71 133/73 123/70  Pulse:  99 88 86  Resp:  17 15 17   Temp:      TempSrc:      SpO2: 94% 96% 93% 94%  Weight:      Height:       Wt Readings from Last 3 Encounters:  07/16/18 106.6 kg  05/05/18 105.8 kg  12/18/17 110.9 kg    PHYSICAL EXAMINATION:  Physical Exam  Vitals reviewed. Constitutional: He is oriented to person, place, and time. He appears well-developed and well-nourished. No distress.  HENT:  Head: Normocephalic and atraumatic.  Mouth/Throat: Oropharynx is clear and moist.  Eyes: Pupils are equal, round, and reactive to light. Conjunctivae and EOM are normal. No scleral icterus.  Neck: Normal range of motion. Neck supple. No JVD present. No thyromegaly present.  Cardiovascular: Normal rate, regular rhythm and intact distal pulses. Exam reveals no gallop and no friction rub.  No murmur heard. Respiratory: Effort normal. No respiratory distress. He has no wheezes. He has no rales.  Rhonchi  GI: Soft. Bowel sounds are normal. He exhibits no distension. There is no abdominal tenderness.  Musculoskeletal: Normal range of motion.        General: No edema.     Comments: No arthritis, no gout  Lymphadenopathy:    He has no cervical adenopathy.  Neurological: He is alert and oriented to person, place, and time. No cranial nerve deficit.  No dysarthria, no aphasia  Skin: Skin is warm and dry. No rash noted. No erythema.  Psychiatric: He has a normal mood and affect. His behavior is normal. Judgment and thought content normal.    LABORATORY PANEL:   CBC Recent Labs  Lab 07/16/18 2019  WBC 21.7*  HGB 15.0  HCT 46.2  PLT 273   ------------------------------------------------------------------------------------------------------------------  Chemistries  Recent Labs  Lab 07/16/18 2019  NA 130*  K 3.8  CL  98  CO2 20*  GLUCOSE <20*  BUN 13  CREATININE 1.13  CALCIUM 8.1*   ------------------------------------------------------------------------------------------------------------------  Cardiac Enzymes Recent Labs  Lab 07/16/18 2019  TROPONINI <0.03   ------------------------------------------------------------------------------------------------------------------  RADIOLOGY:  Dg Chest 2 View  Result Date: 07/16/2018 CLINICAL DATA:  Shortness of breath and history of pneumonia EXAM: CHEST - 2 VIEW COMPARISON:  04/27/2018 FINDINGS: The heart size and mediastinal contours are within normal limits. Both lungs are clear. The visualized skeletal structures are unremarkable. IMPRESSION: No active cardiopulmonary disease. Electronically Signed   By: Ulyses Jarred M.D.   On: 07/16/2018 21:09    EKG:   Orders placed or performed during the hospital encounter of 07/16/18  . ED EKG  . ED EKG  . EKG 12-Lead  . EKG 12-Lead    IMPRESSION AND PLAN:  Principal Problem:   HCAP (healthcare-associated pneumonia) -IV antibiotics, PRN antitussive and duo nebs  Active Problems:   COPD with acute exacerbation (HCC) -Solu-Medrol, duo nebs as above, antibiotics as above, continue home inhalers   Hypoglycemia -patient does not have diabetes or history of previous glucose control issues.  Every 2 hour fingerstick glucose checks for tonight until he has stabilized   Hyperlipidemia -Home dose antilipid  Chart review performed and case discussed with ED provider. Labs, imaging and/or ECG reviewed by provider and discussed with patient/family. Management plans discussed with the patient and/or family.  DVT PROPHYLAXIS: SubQ lovenox   GI PROPHYLAXIS:  None  ADMISSION STATUS: Inpatient     CODE STATUS: Full  TOTAL TIME TAKING CARE OF THIS PATIENT: 45 minutes.   Antonio Gonzalez FIELDING 07/16/2018, 10:56 PM  [facility]  Office  [phone]  CC: Primary care physician; Boyce Medici, FNP  Note:  This document was prepared using Dragon voice recognition software and may include unintentional dictation errors.

## 2018-07-16 NOTE — ED Notes (Signed)
BGL <20, verbal order for D50 given.

## 2018-07-16 NOTE — ED Provider Notes (Addendum)
[facility] Emergency Department Provider Note       Time seen: ----------------------------------------- 8:46 PM on 07/16/2018 -----------------------------------------   I have reviewed the triage vital signs and the nursing notes.  HISTORY   Chief Complaint Shortness of Breath    HPI Antonio Gonzalez is a 62 y.o. male with a history of COPD, pneumonia, hyperlipidemia who presents to the ED for shortness of breath.  Patient describes that he was diagnosed with pneumonia 1 month ago.  He does have a history of COPD.  In route he was given 3 duo nebs and Solu-Medrol with some improvement.  He describes diaphoresis and general weakness.  History reviewed. No pertinent past medical history.  Patient Active Problem List   Diagnosis Date Noted  . COPD (chronic obstructive pulmonary disease) ([place]) 03/27/2016  . Low back pain 10/17/2015  . Hyperlipidemia 04/06/2015  . Dislocation of finger PIP joint 04/06/2015    History reviewed. No pertinent surgical history.  Allergies Patient has no known allergies.  Social History Social History   Tobacco Use  . Smoking status: Current Every Day Smoker    Packs/day: 0.25    Years: 40.00    Pack years: 10.00  . Smokeless tobacco: Current User  Substance Use Topics  . Alcohol use: Yes    Comment: seldom  . Drug use: Not on file   Review of Systems Constitutional: Negative for fever. Cardiovascular: Negative for chest pain. Respiratory: Positive for shortness of breath and cough Gastrointestinal: Negative for abdominal pain, vomiting and diarrhea. Musculoskeletal: Negative for back pain. Skin: Positive for diaphoresis Neurological: Negative for headaches, focal weakness or numbness.  All systems negative/normal/unremarkable except as stated in the HPI  ____________________________________________   PHYSICAL EXAM:  VITAL SIGNS: ED Triage Vitals  Enc Vitals Group     BP 07/16/18 2012 110/71     Pulse  Rate 07/16/18 2012 (!) 111     Resp 07/16/18 2012 (!) 22     Temp 07/16/18 2012 98.5 F (36.9 C)     Temp Source 07/16/18 2012 Oral     SpO2 07/16/18 2012 94 %     Weight 07/16/18 2012 235 lb (106.6 kg)     Height 07/16/18 2012 6' (1.829 m)     Head Circumference --      Peak Flow --      Pain Score 07/16/18 2011 7     Pain Loc --      Pain Edu? --      Excl. in [place]? --    Constitutional: Alert and oriented.  Mild distress Eyes: Conjunctivae are normal. Normal extraocular movements. ENT   Head: Normocephalic and atraumatic.   Nose: No congestion/rhinnorhea.   Mouth/Throat: Mucous membranes are moist.   Neck: No stridor. Cardiovascular: Normal rate, regular rhythm. No murmurs, rubs, or gallops. Respiratory: Mild tachypnea with wheezing bilaterally Gastrointestinal: Soft and nontender. Normal bowel sounds Musculoskeletal: Nontender with normal range of motion in extremities. No lower extremity tenderness nor edema. Neurologic:  Normal speech and language. No gross focal neurologic deficits are appreciated.  Skin: Diaphoresis is noted Psychiatric: Mood and affect are normal. Speech and behavior are normal.   ____________________________________________  ED COURSE:  As part of my medical decision making, I reviewed the following data within the [place] History obtained from family if available, nursing notes, old chart and ekg, as well as notes from prior ED visits. Patient presented for likely COPD exacerbation, we will assess with labs and imaging as  indicated at this time.   Procedures ____________________________________________   LABS (pertinent positives/negatives)  Labs Reviewed  CBC WITH DIFFERENTIAL/PLATELET - Abnormal; Notable for the following components:      Result Value   WBC 21.7 (*)    Neutro Abs 19.4 (*)    Lymphs Abs 0.4 (*)    Monocytes Absolute 1.5 (*)    Abs Immature Granulocytes 0.21 (*)    All other components within  normal limits  BASIC METABOLIC PANEL - Abnormal; Notable for the following components:   Sodium 130 (*)    CO2 20 (*)    Glucose, Bld <20 (*)    Calcium 8.1 (*)    All other components within normal limits  BRAIN NATRIURETIC PEPTIDE - Abnormal; Notable for the following components:   B Natriuretic Peptide 164.0 (*)    All other components within normal limits  BLOOD GAS, VENOUS - Abnormal; Notable for the following components:   pO2, Ven 70.0 (*)    All other components within normal limits  TROPONIN I  URINALYSIS, COMPLETE (UACMP) WITH MICROSCOPIC   EKG: Sinus tachycardia with a rate of 100 bpm, normal PR interval, normal QRS, normal QT  RADIOLOGY Images were viewed by me  Chest x-ray IMPRESSION: No active cardiopulmonary disease. ____________________________________________  DIFFERENTIAL DIAGNOSIS   COPD, pneumonia, URI, pneumothorax, PE, MI  FINAL ASSESSMENT AND PLAN  COPD exacerbation, hypoglycemia   Plan: The patient had presented for shortness of breath with some improvement after DuoNeb's and steroids in route. Patient's labs reveal significant leukocytosis with a left shift.  Also patient had low blood sugar of uncertain etiology.  He was given an amp of D50.  Patient's imaging revealed pneumonia.  I ordered CT imaging of the abdomen pelvis to evaluate for occult infection.  Overall he warrants admission with occasional nebs, scheduled steroids and likely antibiotics.  I also ordered IV Levaquin here.   Laurence Aly, MD   Note: This note was generated in part or whole with voice recognition software. Voice recognition is usually quite accurate but there are transcription errors that can and very often do occur. I apologize for any typographical errors that were not detected and corrected.     Earleen Newport, MD 07/16/18 2149    Earleen Newport, MD 07/16/18 2206

## 2018-07-16 NOTE — ED Triage Notes (Signed)
Patient arrives with c/o SOB. Hx PNA x1 month ago. Hx COPD. 3 duo-nebs given, 125mg  solumedrol. 20g inserted into left hand.

## 2018-07-16 NOTE — ED Notes (Signed)
Patient transported to X-ray 

## 2018-07-16 NOTE — Progress Notes (Addendum)
Pharmacy Antibiotic Note  Antonio Gonzalez is a 62 y.o. male admitted on 07/16/2018 with suspected pneumonia.  Pharmacy has been consulted for Levaqiun dosing.  Plan: Levaquin 750 mg IV q 24 hours ordered. Changed to vanc/cefepime on admission.  Vancomycin DW 89kg  Vd 62L kei 0.074 hr-1  T1/2 9 hours Vancomycin 1500 mg q 12 hours ordered with stacked dosing. Level before 5th dose. Goal trough 15-20.  Cefepime 2 grams q 8 hours ordered  Height: 6' (182.9 cm) Weight: 235 lb (106.6 kg) IBW/kg (Calculated) : 77.6  Temp (24hrs), Avg:98.5 F (36.9 C), Min:98.5 F (36.9 C), Max:98.5 F (36.9 C)  Recent Labs  Lab 07/16/18 2019  WBC 21.7*  CREATININE 1.13    Estimated Creatinine Clearance: 84.4 mL/min (by C-G formula based on SCr of 1.13 mg/dL).    No Known Allergies  Antimicrobials this admission: Levaquin 12/12  >> vancomycin, cefepime 12/13   >>   Dose adjustments this admission:   Microbiology results: 12/13 MRSA PCR: pending     12/12 UA: (-)   Thank you for allowing pharmacy to be a part of this patient's care.  Yaser Harvill S 07/16/2018 11:22 PM

## 2018-07-17 ENCOUNTER — Inpatient Hospital Stay: Payer: Self-pay

## 2018-07-17 LAB — CBC
HCT: 44.8 % (ref 39.0–52.0)
Hemoglobin: 14.4 g/dL (ref 13.0–17.0)
MCH: 29.8 pg (ref 26.0–34.0)
MCHC: 32.1 g/dL (ref 30.0–36.0)
MCV: 92.8 fL (ref 80.0–100.0)
Platelets: 268 10*3/uL (ref 150–400)
RBC: 4.83 MIL/uL (ref 4.22–5.81)
RDW: 13.3 % (ref 11.5–15.5)
WBC: 17.4 10*3/uL — AB (ref 4.0–10.5)
nRBC: 0 % (ref 0.0–0.2)

## 2018-07-17 LAB — BASIC METABOLIC PANEL
Anion gap: 10 (ref 5–15)
BUN: 15 mg/dL (ref 8–23)
CO2: 24 mmol/L (ref 22–32)
Calcium: 8.5 mg/dL — ABNORMAL LOW (ref 8.9–10.3)
Chloride: 101 mmol/L (ref 98–111)
Creatinine, Ser: 0.89 mg/dL (ref 0.61–1.24)
GFR calc Af Amer: >60 mL/min (ref >60)
GFR calc non Af Amer: >60 mL/min (ref >60)
Glucose, Bld: 195 mg/dL — ABNORMAL HIGH (ref 70–99)
Potassium: 4.1 mmol/L (ref 3.5–5.1)
SODIUM: 135 mmol/L (ref 135–145)

## 2018-07-17 LAB — PROCALCITONIN: Procalcitonin: 0.88 ng/mL

## 2018-07-17 LAB — GLUCOSE, CAPILLARY
Glucose-Capillary: 163 mg/dL — ABNORMAL HIGH (ref 70–99)
Glucose-Capillary: 217 mg/dL — ABNORMAL HIGH (ref 70–99)
Glucose-Capillary: 212 mg/dL — ABNORMAL HIGH (ref 70–99)

## 2018-07-17 LAB — MRSA PCR SCREENING: MRSA by PCR: NEGATIVE

## 2018-07-17 LAB — RESPIRATORY PANEL BY PCR
Adenovirus: NOT DETECTED
Bordetella pertussis: NOT DETECTED
CORONAVIRUS HKU1-RVPPCR: NOT DETECTED
Chlamydophila pneumoniae: NOT DETECTED
Coronavirus 229E: NOT DETECTED
Coronavirus NL63: NOT DETECTED
Coronavirus OC43: NOT DETECTED
Influenza A: NOT DETECTED
Influenza B: NOT DETECTED
Metapneumovirus: NOT DETECTED
Mycoplasma pneumoniae: NOT DETECTED
Parainfluenza Virus 1: NOT DETECTED
Parainfluenza Virus 2: NOT DETECTED
Parainfluenza Virus 3: NOT DETECTED
Parainfluenza Virus 4: NOT DETECTED
Respiratory Syncytial Virus: DETECTED — AB
Rhinovirus / Enterovirus: NOT DETECTED

## 2018-07-17 MED ORDER — POLYVINYL ALCOHOL 1.4 % OP SOLN
1.0000 [drp] | OPHTHALMIC | Status: DC | PRN
  Administered 2018-07-17: 1 [drp] via OPHTHALMIC
  Filled 2018-07-17: qty 15

## 2018-07-17 MED ORDER — ACETAMINOPHEN 325 MG PO TABS: 650.0000 mg | ORAL_TABLET | Freq: Four times a day (QID) | ORAL | Status: DC | PRN

## 2018-07-17 MED ORDER — SIMVASTATIN 20 MG PO TABS
20.0000 mg | ORAL_TABLET | Freq: Every day | ORAL | Status: DC
  Administered 2018-07-17 (×2): 20 mg via ORAL
  Filled 2018-07-17 (×2): qty 1

## 2018-07-17 MED ORDER — TIOTROPIUM BROMIDE MONOHYDRATE 18 MCG IN CAPS
18.0000 ug | ORAL_CAPSULE | Freq: Every day | RESPIRATORY_TRACT | Status: DC
  Administered 2018-07-17 – 2018-07-18 (×2): 18 ug via RESPIRATORY_TRACT
  Filled 2018-07-17: qty 5

## 2018-07-17 MED ORDER — METHYLPREDNISOLONE SODIUM SUCC 125 MG IJ SOLR
60.0000 mg | Freq: Four times a day (QID) | INTRAMUSCULAR | Status: DC
  Administered 2018-07-17 – 2018-07-18 (×6): 60 mg via INTRAVENOUS
  Filled 2018-07-17 (×6): qty 2

## 2018-07-17 MED ORDER — ACETAMINOPHEN 650 MG RE SUPP: 650.0000 mg | Freq: Four times a day (QID) | RECTAL | Status: DC | PRN

## 2018-07-17 MED ORDER — ONDANSETRON HCL 4 MG/2ML IJ SOLN: 4.0000 mg | Freq: Four times a day (QID) | INTRAMUSCULAR | Status: DC | PRN

## 2018-07-17 MED ORDER — VANCOMYCIN HCL 10 G IV SOLR
1500.0000 mg | Freq: Two times a day (BID) | INTRAVENOUS | Status: DC
  Administered 2018-07-17 (×2): 1500 mg via INTRAVENOUS
  Filled 2018-07-17 (×4): qty 1500

## 2018-07-17 MED ORDER — ONDANSETRON HCL 4 MG PO TABS: 4.0000 mg | ORAL_TABLET | Freq: Four times a day (QID) | ORAL | Status: DC | PRN

## 2018-07-17 MED ORDER — GUAIFENESIN ER 600 MG PO TB12
600.0000 mg | ORAL_TABLET | Freq: Two times a day (BID) | ORAL | Status: DC
  Administered 2018-07-18 (×2): 600 mg via ORAL
  Filled 2018-07-17 (×2): qty 1

## 2018-07-17 MED ORDER — MOMETASONE FURO-FORMOTEROL FUM 200-5 MCG/ACT IN AERO
2.0000 | INHALATION_SPRAY | Freq: Two times a day (BID) | RESPIRATORY_TRACT | Status: DC
  Administered 2018-07-17 – 2018-07-18 (×4): 2 via RESPIRATORY_TRACT
  Filled 2018-07-17: qty 8.8

## 2018-07-17 MED ORDER — IPRATROPIUM-ALBUTEROL 0.5-2.5 (3) MG/3ML IN SOLN
3.0000 mL | RESPIRATORY_TRACT | Status: DC | PRN
  Administered 2018-07-17: 3 mL via RESPIRATORY_TRACT
  Filled 2018-07-17: qty 3

## 2018-07-17 MED ORDER — GUAIFENESIN-DM 100-10 MG/5ML PO SYRP: 5.0000 mL | ORAL_SOLUTION | ORAL | Status: DC | PRN

## 2018-07-17 MED ORDER — ENOXAPARIN SODIUM 40 MG/0.4ML ~~LOC~~ SOLN
40.0000 mg | SUBCUTANEOUS | Status: DC
  Administered 2018-07-17: 40 mg via SUBCUTANEOUS
  Filled 2018-07-17: qty 0.4

## 2018-07-17 MED ORDER — DEXTROMETHORPHAN POLISTIREX ER 30 MG/5ML PO SUER
30.0000 mg | Freq: Two times a day (BID) | ORAL | Status: DC
  Administered 2018-07-17 – 2018-07-18 (×2): 30 mg via ORAL
  Filled 2018-07-17 (×3): qty 5

## 2018-07-17 MED ORDER — DM-GUAIFENESIN ER 30-600 MG PO TB12
1.0000 | ORAL_TABLET | Freq: Two times a day (BID) | ORAL | Status: DC
  Filled 2018-07-17 (×2): qty 1

## 2018-07-17 NOTE — Care Management (Addendum)
Patient is open to Medication management [facility])  and [facility]). He has had some problems getting his Spirva with [facility] recently and I have reached out to them to follow up.  He is currently on Advair which he denies having problems obtaining or any other medications through [facility].  He does not have health insurance. PCP with [place]. He drives. Lives with 2 other roommate.  Independent with mobility. He said he has a working glucometer.

## 2018-07-17 NOTE — Progress Notes (Signed)
[place] at [place] NAME: Antonio Gonzalez    MR#:  [id number]  DATE OF BIRTH:  07-25-1956  SUBJECTIVE:   Patient states that his shortness of breath has improved a little bit today.  Still endorsing a lot of fatigue and malaise.  REVIEW OF SYSTEMS:  Review of Systems  Constitutional: Positive for malaise/fatigue. Negative for chills and fever.  HENT: Negative for congestion and sore throat.   Eyes: Negative for blurred vision.  Respiratory: Positive for cough and shortness of breath.   Cardiovascular: Negative for chest pain, palpitations and leg swelling.  Gastrointestinal: Negative for nausea and vomiting.  Genitourinary: Negative for dysuria and urgency.  Musculoskeletal: Negative for back pain and neck pain.  Neurological: Positive for weakness. Negative for dizziness, focal weakness and headaches.  Psychiatric/Behavioral: Negative for depression. The patient is not nervous/anxious.     DRUG ALLERGIES:  No Known Allergies VITALS:  Blood pressure 135/75, pulse 88, temperature 98.2 F (36.8 C), temperature source Oral, resp. rate 18, height 6' (1.829 m), weight 106.6 kg, SpO2 94 %. PHYSICAL EXAMINATION:  Physical Exam  General: Laying in bed, in no acute distress HEENT: Normocephalic, atraumatic, EOMI, no scleral icterus, dry mucous membranes Neck: Normal range of motion. Neck supple. No JVD present. No thyromegaly present.  Cardiovascular: RRR, no murmurs, rubs, gallops Respiratory: + Diffuse rhonchi present throughout all lung fields, no crackles or wheezing, normal work of breathing GI: +BS, soft, nontender, nondistended Musculoskeletal:  No pedal edema, cyanosis, clubbing  Neurological: CN II through XII grossly intact, no focal deficits, sensation intact throughout, gait not assessed.  Skin: Skin is warm and dry. No rash noted. No erythema.  Psychiatric: He has a normal mood and affect. His behavior is normal. Judgment and  thought content normal.  LABORATORY PANEL:  Male CBC Recent Labs  Lab 07/17/18 0426  WBC 17.4*  HGB 14.4  HCT 44.8  PLT 268   ------------------------------------------------------------------------------------------------------------------ Chemistries  Recent Labs  Lab 07/17/18 0426  NA 135  K 4.1  CL 101  CO2 24  GLUCOSE 195*  BUN 15  CREATININE 0.89  CALCIUM 8.5*   RADIOLOGY:  Dg Chest 2 View  Result Date: 07/17/2018 CLINICAL DATA:  Cough x1 week, suspected pneumonia. Hx of COPD. Smoker. EXAM: CHEST - 2 VIEW COMPARISON:  07/16/2018 FINDINGS: Patchy interstitial and airspace opacities in the lingula obscuring the left heart border. Right lung clear, hyperinflated. Heart size and mediastinal contours are within normal limits. No effusion. Visualized bones unremarkable. IMPRESSION: Lingular airspace disease suggesting pneumonia Electronically Signed   By: Lucrezia Europe M.D.   On: 07/17/2018 11:31   Dg Chest 2 View  Result Date: 07/16/2018 CLINICAL DATA:  Shortness of breath and history of pneumonia EXAM: CHEST - 2 VIEW COMPARISON:  04/27/2018 FINDINGS: The heart size and mediastinal contours are within normal limits. Both lungs are clear. The visualized skeletal structures are unremarkable. IMPRESSION: No active cardiopulmonary disease. Electronically Signed   By: Ulyses Jarred M.D.   On: 07/16/2018 21:09   ASSESSMENT AND PLAN:   HCAP-  Improving, on room air this morning. Repeat CXR this morning with lingular airspace disease. Procalcitonin elevated. -Continue IV antibiotics -Duonebs as needed -Recheck procalcitonin in the morning -RVP pending -Ambulate with pulse ox prior to discharge   COPD with acute exacerbation- improving -Continue solumedrol -Duonebs as needed -Continue home inhalers   Hypoglycemia-glucose was less than 20 on BMP in the ED.  No history of diabetes.  Blood  sugars are now in the 160s to 200s.   -Stop every 2 hours CBGs  Hyperlipidemia-  stable -Continue home statin  All the records are reviewed and case discussed with Care Management/Social Worker. Management plans discussed with the patient, family and they are in agreement.  CODE STATUS: Full Code  TOTAL TIME TAKING CARE OF THIS PATIENT: 40 minutes.   More than 50% of the time was spent in counseling/coordination of care: YES  POSSIBLE D/C IN 1-2 DAYS, DEPENDING ON CLINICAL CONDITION.   Berna Spare Mayo M.D on 07/17/2018 at 5:07 PM  Between 7am to 6pm - Pager - [phone]  After 6pm go to [email or web address] - Proofreader  [facility]  Office  [phone]  CC: Primary care physician; Boyce Medici, FNP  Note: This dictation was prepared with Dragon dictation along with smaller phrase technology. Any transcriptional errors that result from this process are unintentional.

## 2018-07-18 LAB — CBC
HCT: 39.3 % (ref 39.0–52.0)
Hemoglobin: 12.8 g/dL — ABNORMAL LOW (ref 13.0–17.0)
MCH: 30.5 pg (ref 26.0–34.0)
MCHC: 32.6 g/dL (ref 30.0–36.0)
MCV: 93.8 fL (ref 80.0–100.0)
Platelets: 284 10*3/uL (ref 150–400)
RBC: 4.19 MIL/uL — ABNORMAL LOW (ref 4.22–5.81)
RDW: 13.4 % (ref 11.5–15.5)
WBC: 27.9 10*3/uL — ABNORMAL HIGH (ref 4.0–10.5)
nRBC: 0 % (ref 0.0–0.2)

## 2018-07-18 LAB — BASIC METABOLIC PANEL
Anion gap: 8 (ref 5–15)
BUN: 20 mg/dL (ref 8–23)
CO2: 25 mmol/L (ref 22–32)
Calcium: 8.3 mg/dL — ABNORMAL LOW (ref 8.9–10.3)
Chloride: 100 mmol/L (ref 98–111)
Creatinine, Ser: 0.76 mg/dL (ref 0.61–1.24)
GFR calc Af Amer: >60 mL/min (ref >60)
GFR calc non Af Amer: >60 mL/min (ref >60)
GLUCOSE: 356 mg/dL — AB (ref 70–99)
Potassium: 4.8 mmol/L (ref 3.5–5.1)
Sodium: 133 mmol/L — ABNORMAL LOW (ref 135–145)

## 2018-07-18 LAB — GLUCOSE, CAPILLARY
Glucose-Capillary: 269 mg/dL — ABNORMAL HIGH (ref 70–99)
Glucose-Capillary: 301 mg/dL — ABNORMAL HIGH (ref 70–99)

## 2018-07-18 LAB — PROCALCITONIN: Procalcitonin: 0.75 ng/mL

## 2018-07-18 LAB — HIV ANTIBODY (ROUTINE TESTING W REFLEX): HIV Screen 4th Generation wRfx: NONREACTIVE

## 2018-07-18 MED ORDER — FLEET ENEMA 7-19 GM/118ML RE ENEM: 1.0000 | ENEMA | Freq: Once | RECTAL | Status: DC

## 2018-07-18 MED ORDER — SENNA 8.6 MG PO TABS
1.0000 | ORAL_TABLET | Freq: Every day | ORAL | Status: DC
  Administered 2018-07-18: 8.6 mg via ORAL
  Filled 2018-07-18: qty 1

## 2018-07-18 MED ORDER — PREDNISONE 50 MG PO TABS: ORAL_TABLET | ORAL | 0 refills | Status: AC

## 2018-07-18 MED ORDER — LACTULOSE 10 GM/15ML PO SOLN
20.0000 g | Freq: Two times a day (BID) | ORAL | Status: DC
  Filled 2018-07-18: qty 30

## 2018-07-18 MED ORDER — METHYLPREDNISOLONE SODIUM SUCC 40 MG IJ SOLR: 40.0000 mg | Freq: Two times a day (BID) | INTRAMUSCULAR | Status: DC

## 2018-07-18 MED ORDER — LEVOFLOXACIN 750 MG PO TABS: 750.0000 mg | ORAL_TABLET | Freq: Every day | ORAL | 0 refills | Status: DC

## 2018-07-18 MED ORDER — LEVOFLOXACIN 500 MG PO TABS
750.0000 mg | ORAL_TABLET | Freq: Every day | ORAL | Status: DC
  Administered 2018-07-18: 750 mg via ORAL
  Filled 2018-07-18: qty 2

## 2018-07-18 MED ORDER — PREDNISONE 50 MG PO TABS: 50.0000 mg | ORAL_TABLET | Freq: Every day | ORAL | Status: DC

## 2018-07-18 NOTE — Progress Notes (Signed)
This patient burrated this Probation officer about discharge. Can not find a ride and stated for me to leave room

## 2018-07-18 NOTE — Discharge Summary (Signed)
[facility] NAME: Antonio Gonzalez    MR#:  [id number]  DATE OF BIRTH:  1955-10-18  DATE OF ADMISSION:  07/16/2018 ADMITTING PHYSICIAN: Lance Coon, MD  DATE OF DISCHARGE: July 18, 2018  PRIMARY CARE PHYSICIAN: Boyce Medici, FNP    ADMISSION DIAGNOSIS:  Hypoglycemia [E16.2] COPD exacerbation ([place]) [J44.1]  DISCHARGE DIAGNOSIS:  Principal Problem:   HCAP (healthcare-associated pneumonia) Active Problems:   Hyperlipidemia   COPD with acute exacerbation ([place])   Hypoglycemia   SECONDARY DIAGNOSIS:   Past Medical History:  Diagnosis Date  . COPD (chronic obstructive pulmonary disease) ([place])   . HLD (hyperlipidemia)     HOSPITAL COURSE:   62 year old male with history of COPD and tobacco dependence who presented with shortness of breath. 1.  Acute hypoxic respiratory failure: Patient has been weaned off of oxygen.  2.  Pneumonia: Patient was treated for HCAP He will be discharged on oral Levaquin Procalcitonin level has improved.  3.  Acute exacerbation of COPD: Patient has no wheezing at the time of discharge.  He will be discharged on oral prednisone for 4 more days.  4.  Hypoglycemia: Patient was noted to have low blood sugar in the ER however after steroids his blood sugars went up.  5.  Hyperlipidemia: Continue statin  6.Tobacco dependence: Patient is encouraged to quit smoking. Counseling was provided for 4 minutes.  7.  Constipation: Patient was provided medication to relieve constipation.   DISCHARGE CONDITIONS AND DIET:   Stable for discharge on regular diet  CONSULTS OBTAINED:  Treatment Team:  Bettey Costa, MD  DRUG ALLERGIES:  No Known Allergies  DISCHARGE MEDICATIONS:   Allergies as of 07/18/2018   No Known Allergies     Medication List    TAKE these medications   albuterol 108 (90 Base) MCG/ACT inhaler Commonly known as:  PROVENTIL HFA;VENTOLIN HFA Inhale 2 puffs into the lungs every  6 (six) hours as needed for wheezing or shortness of breath.   Fluticasone-Salmeterol 250-50 MCG/DOSE Aepb Commonly known as:  ADVAIR DISKUS Inhale 1 puff into the lungs 2 (two) times daily.   levofloxacin 750 MG tablet Commonly known as:  LEVAQUIN Take 1 tablet (750 mg total) by mouth daily.   naproxen 500 MG tablet Commonly known as:  NAPROSYN Take 1-2 tablets (500-1,000 mg total) by mouth at bedtime.   predniSONE 50 MG tablet Commonly known as:  DELTASONE Take 1 tablet daily for 4 days   simvastatin 20 MG tablet Commonly known as:  ZOCOR Take 1 tablet (20 mg total) by mouth every evening. What changed:  when to take this   tiotropium 18 MCG inhalation capsule Commonly known as:  SPIRIVA Place 18 mcg into inhaler and inhale daily.         Today   CHIEF COMPLAINT:   Shortness of breath is improved.   VITAL SIGNS:  Blood pressure 138/73, pulse 74, temperature 98.1 F (36.7 C), temperature source Oral, resp. rate 20, height 6' (1.829 m), weight 106.6 kg, SpO2 94 %.   REVIEW OF SYSTEMS:  Review of Systems  Constitutional: Negative.  Negative for chills, fever and malaise/fatigue.  HENT: Negative.  Negative for ear discharge, ear pain, hearing loss, nosebleeds and sore throat.   Eyes: Negative.  Negative for blurred vision and pain.  Respiratory: Negative.  Negative for cough, hemoptysis, shortness of breath and wheezing.   Cardiovascular: Negative.  Negative for chest pain, palpitations and leg swelling.  Gastrointestinal: Positive  for constipation. Negative for abdominal pain, blood in stool, diarrhea, nausea and vomiting.  Genitourinary: Negative.  Negative for dysuria.  Musculoskeletal: Negative.  Negative for back pain.  Skin: Negative.   Neurological: Negative for dizziness, tremors, speech change, focal weakness, seizures and headaches.  Endo/Heme/Allergies: Negative.  Does not bruise/bleed easily.  Psychiatric/Behavioral: Negative.  Negative for  depression, hallucinations and suicidal ideas.     PHYSICAL EXAMINATION:  GENERAL:  62 y.o.-year-old patient lying in the bed with no acute distress.  NECK:  Supple, no jugular venous distention. No thyroid enlargement, no tenderness.  LUNGS: Normal breath sounds bilaterally, no wheezing, rales,rhonchi  No use of accessory muscles of respiration.  CARDIOVASCULAR: S1, S2 normal. No murmurs, rubs, or gallops.  ABDOMEN: Soft, non-tender, non-distended. Bowel sounds present. No organomegaly or mass.  EXTREMITIES: No pedal edema, cyanosis, or clubbing.  PSYCHIATRIC: The patient is alert and oriented x 3.  SKIN: No obvious rash, lesion, or ulcer.   DATA REVIEW:   CBC Recent Labs  Lab 07/18/18 0407  WBC 27.9*  HGB 12.8*  HCT 39.3  PLT 284    Chemistries  Recent Labs  Lab 07/18/18 0407  NA 133*  K 4.8  CL 100  CO2 25  GLUCOSE 356*  BUN 20  CREATININE 0.76  CALCIUM 8.3*    Cardiac Enzymes Recent Labs  Lab 07/16/18 2019  TROPONINI <0.03    Microbiology Results  @MICRORSLT48 @  RADIOLOGY:  Dg Chest 2 View  Result Date: 07/17/2018 CLINICAL DATA:  Cough x1 week, suspected pneumonia. Hx of COPD. Smoker. EXAM: CHEST - 2 VIEW COMPARISON:  07/16/2018 FINDINGS: Patchy interstitial and airspace opacities in the lingula obscuring the left heart border. Right lung clear, hyperinflated. Heart size and mediastinal contours are within normal limits. No effusion. Visualized bones unremarkable. IMPRESSION: Lingular airspace disease suggesting pneumonia Electronically Signed   By: Lucrezia Europe M.D.   On: 07/17/2018 11:31   Dg Chest 2 View  Result Date: 07/16/2018 CLINICAL DATA:  Shortness of breath and history of pneumonia EXAM: CHEST - 2 VIEW COMPARISON:  04/27/2018 FINDINGS: The heart size and mediastinal contours are within normal limits. Both lungs are clear. The visualized skeletal structures are unremarkable. IMPRESSION: No active cardiopulmonary disease. Electronically Signed    By: Ulyses Jarred M.D.   On: 07/16/2018 21:09      Allergies as of 07/18/2018   No Known Allergies     Medication List    TAKE these medications   albuterol 108 (90 Base) MCG/ACT inhaler Commonly known as:  PROVENTIL HFA;VENTOLIN HFA Inhale 2 puffs into the lungs every 6 (six) hours as needed for wheezing or shortness of breath.   Fluticasone-Salmeterol 250-50 MCG/DOSE Aepb Commonly known as:  ADVAIR DISKUS Inhale 1 puff into the lungs 2 (two) times daily.   levofloxacin 750 MG tablet Commonly known as:  LEVAQUIN Take 1 tablet (750 mg total) by mouth daily.   naproxen 500 MG tablet Commonly known as:  NAPROSYN Take 1-2 tablets (500-1,000 mg total) by mouth at bedtime.   predniSONE 50 MG tablet Commonly known as:  DELTASONE Take 1 tablet daily for 4 days   simvastatin 20 MG tablet Commonly known as:  ZOCOR Take 1 tablet (20 mg total) by mouth every evening. What changed:  when to take this   tiotropium 18 MCG inhalation capsule Commonly known as:  SPIRIVA Place 18 mcg into inhaler and inhale daily.         Management plans discussed with the patient and he  is in agreement. Stable for discharge home  Patient should follow up with pcp  CODE STATUS:     Code Status Orders  (From admission, onward)         Start     Ordered   07/17/18 0012  Full code  Continuous     07/17/18 0011        Code Status History    This patient has a current code status but no historical code status.      TOTAL TIME TAKING CARE OF THIS PATIENT: 38 minutes.    Note: This dictation was prepared with Dragon dictation along with smaller phrase technology. Any transcriptional errors that result from this process are unintentional.  Ezana Hubbert M.D on 07/18/2018 at 9:47 AM  Between 7am to 6pm - Pager - [phone] After 6pm go to [email or web address] - password [email or web address] [facility]  Office  [phone]  CC: Primary care physician; Boyce Medici,  FNP

## 2018-07-18 NOTE — Progress Notes (Signed)
Laxatives and fleets enema held. Patient states he had large BM and did not need medications. However, patient agreed to take stool softener. Med was administered.

## 2018-07-18 NOTE — Progress Notes (Signed)
Discharge summary reviewed with verbal understanding. Rxs in hand upon discharge

## 2018-07-18 NOTE — Progress Notes (Signed)
Pharmacy Antibiotic Note  Antonio Gonzalez is a 62 y.o. male admitted on 07/16/2018 with CAP.  Pharmacy has been consulted for Levaquin dosing.  Plan: Levofloxacin 750 mg PO once daily to start today at 1000  Height: 6' (182.9 cm) Weight: 235 lb (106.6 kg) IBW/kg (Calculated) : 77.6  Temp (24hrs), Avg:98.2 F (36.8 C), Min:98.1 F (36.7 C), Max:98.2 F (36.8 C)  Recent Labs  Lab 07/16/18 2019 07/17/18 0426 07/18/18 0407  WBC 21.7* 17.4* 27.9*  CREATININE 1.13 0.89 0.76    Estimated Creatinine Clearance: 119.2 mL/min (by C-G formula based on SCr of 0.76 mg/dL).    No Known Allergies  Antimicrobials this admission: Vancomycin 12/13 Cefepime 12/13 Levofloxacin 12/14 >>  Dose adjustments this admission: NA  Microbiology results: 12/13 Respiratory: RSV 12/13 MRSA PCR: negative  Thank you for allowing pharmacy to be a part of this patient's care.  Tawnya Crook, PharmD Pharmacy Resident  07/18/2018 9:54 AM

## 2018-07-21 ENCOUNTER — Telehealth: Payer: Self-pay

## 2018-07-21 ENCOUNTER — Other Ambulatory Visit: Payer: Self-pay

## 2018-07-21 DIAGNOSIS — J449 Chronic obstructive pulmonary disease, unspecified: Secondary | ICD-10-CM

## 2018-07-21 MED ORDER — UMECLIDINIUM BROMIDE 62.5 MCG/INH IN AEPB: 1.0000 | INHALATION_SPRAY | Freq: Every day | RESPIRATORY_TRACT | Status: DC

## 2018-07-21 MED ORDER — UMECLIDINIUM BROMIDE 62.5 MCG/INH IN AEPB: 1.0000 | INHALATION_SPRAY | Freq: Every day | RESPIRATORY_TRACT | 0 refills | Status: AC

## 2018-07-21 NOTE — Progress Notes (Signed)
Switch from Spiriva to [facility]

## 2018-07-21 NOTE — Telephone Encounter (Signed)
EMMI Follow-up: Noted on the report that the patient didn't receive his discharge papers, wasn't sure who to [place] if there was a change in his condition and no scheduled follow-up appointment. Left a message with my contact information for the patient to [place] me at his convenience.

## 2018-07-24 ENCOUNTER — Telehealth: Payer: Self-pay | Admitting: Pharmacist

## 2018-07-24 NOTE — Telephone Encounter (Signed)
07/24/2018 3:51:36 PM - Ventolin refill  07/24/18 Placed refill online with [facility] for Ventolin, to release 08/26/18, order# [id number].Delos Haring

## 2018-08-06 ENCOUNTER — Telehealth: Payer: Self-pay | Admitting: Pharmacist

## 2018-08-06 NOTE — Telephone Encounter (Signed)
08/06/2018 11:27:05 AM - Advair refill  08/06/18 Placed refill online with [facility] for Advair 250/50, to ship 10/09/18, order# [id number].Delos Haring

## 2018-08-14 ENCOUNTER — Other Ambulatory Visit: Payer: Self-pay | Admitting: Internal Medicine

## 2018-08-14 ENCOUNTER — Other Ambulatory Visit: Payer: Self-pay | Admitting: Urology

## 2018-08-14 DIAGNOSIS — E7889 Other lipoprotein metabolism disorders: Secondary | ICD-10-CM

## 2018-08-17 ENCOUNTER — Telehealth: Payer: Self-pay | Admitting: Urology

## 2018-08-17 NOTE — Telephone Encounter (Signed)
Antonio Gonzalez was seen in the ED again for a COPD exacerbation in December.  He will need a follow up appointment.

## 2018-08-17 NOTE — Telephone Encounter (Signed)
08/17/2018 10:23:48 AM - [facility] Ellipta for enrollment  08/17/2018 Faxed script to [facility] for New Med-Enrollment for Incruse Ellipta 62.33mcg Inhale 1 puff into the lungs every day replaces Spiriva.Delos Haring

## 2018-08-20 ENCOUNTER — Other Ambulatory Visit: Payer: Self-pay

## 2018-08-20 ENCOUNTER — Encounter: Payer: Self-pay | Admitting: Gerontology

## 2018-08-20 ENCOUNTER — Ambulatory Visit: Payer: Self-pay | Admitting: Gerontology

## 2018-08-20 VITALS — BP 128/87 | HR 97 | Ht 72.0 in | Wt 242.1 lb

## 2018-08-20 DIAGNOSIS — E7889 Other lipoprotein metabolism disorders: Secondary | ICD-10-CM

## 2018-08-20 DIAGNOSIS — Z0000 Encounter for general adult medical examination without abnormal findings: Secondary | ICD-10-CM

## 2018-08-20 DIAGNOSIS — Z87891 Personal history of nicotine dependence: Secondary | ICD-10-CM

## 2018-08-20 DIAGNOSIS — J449 Chronic obstructive pulmonary disease, unspecified: Secondary | ICD-10-CM

## 2018-08-20 NOTE — Progress Notes (Signed)
Patient: Antonio Gonzalez Male    DOB: 1956/06/10   63 y.o.   MRN: [id number] Visit Date: 08/20/2018  Today's Provider: Langston Reusing, NP   Chief Complaint  Patient presents with  . Follow-up    COPD, recently in hospital for difficulty breathing; possible virus   Subjective:    HPI   Antonio Gonzalez is a 63 y/o male who presents for f/u COPD and hyperlipidemia. He was seen at the ED on 07/18/18 for copd exacerbation and pneumonia. He had few readings of  hyperglycemia during hospitalization and blood glucose was rechecked during office visit and it was 95 mg/dl.  Currently, he denies shortness of breath, wheezing, and he continues to smoke 2-3 cigarette a day and is working on quitting. He denies fever chills, chest pain, palpitation and light headedness.  He reports taking 20 mg simvastatin for hyperlipidemia , and he denies myalgia.  He c/o having a knot to his left wrist that erupted 1 week ago, he denies pain but stated that it's inconveniencing. Also he requested dermatology referral for the removal of 2 moles on his right temporal area.  He stated that he needs dental care for chipped crown to his upper incisor tooth and he denies tooth ache and pain when chewing.      No Known Allergies Previous Medications   ALBUTEROL (PROVENTIL HFA;VENTOLIN HFA) 108 (90 BASE) MCG/ACT INHALER    Inhale 2 puffs into the lungs every 6 (six) hours as needed for wheezing or shortness of breath.   FLUTICASONE-SALMETEROL (ADVAIR DISKUS) 250-50 MCG/DOSE AEPB    Inhale 1 puff into the lungs 2 (two) times daily.   LEVOFLOXACIN (LEVAQUIN) 750 MG TABLET    Take 1 tablet (750 mg total) by mouth daily.   NAPROXEN (NAPROSYN) 500 MG TABLET    TAKE 1 TABLET BY MOUTH 2 TIMES A DAY WITH FOOD   SIMVASTATIN (ZOCOR) 20 MG TABLET    TAKE ONE TABLET BY MOUTH EVERY EVENING   TIOTROPIUM (SPIRIVA) 18 MCG INHALATION CAPSULE    Place 18 mcg into inhaler and inhale daily.   UMECLIDINIUM BROMIDE (INCRUSE ELLIPTA) 62.5 MCG/INH  AEPB    Inhale 1 puff into the lungs daily.    Review of Systems  Constitutional: Negative.   HENT: Negative.   Respiratory: Positive for shortness of breath (occasional with exertion).   Cardiovascular: Negative.   Gastrointestinal: Abdominal distention: central obesity.  Genitourinary: Negative.   Musculoskeletal: Negative.   Skin: Negative.   Neurological: Negative.   Psychiatric/Behavioral: Negative.     Social History   Tobacco Use  . Smoking status: Current Every Day Smoker    Packs/day: 0.25    Years: 40.00    Pack years: 10.00    Types: Cigarettes  . Smokeless tobacco: Current User  Substance Use Topics  . Alcohol use: Yes    Comment: seldom   Objective:   BP 128/87 (BP Location: Left Arm, Patient Position: Sitting)   Pulse 97   Ht 6' (1.829 m)   Wt 242 lb 1.6 oz (109.8 kg)   SpO2 94%   BMI 32.83 kg/m   Physical Exam Constitutional:      Appearance: Normal appearance.  HENT:     Head: Normocephalic and atraumatic.     Mouth/Throat:     Mouth: Mucous membranes are moist.   Eyes:     Extraocular Movements: Extraocular movements intact.     Pupils: Pupils are equal, round, and reactive to light.  Neck:  Musculoskeletal: Normal range of motion.  Cardiovascular:     Rate and Rhythm: Normal rate and regular rhythm.     Pulses: Normal pulses.     Heart sounds: Normal heart sounds.  Pulmonary:     Effort: Pulmonary effort is normal.     Breath sounds: Normal breath sounds.  Abdominal:     General: Bowel sounds are normal.     Palpations: Abdomen is soft.  Musculoskeletal: Normal range of motion.  Skin:    General: Skin is warm and dry.     Findings: Lesion (2 moles to right temporal area) present.  Neurological:     General: No focal deficit present.     Mental Status: He is alert and oriented to person, place, and time.  Psychiatric:        Mood and Affect: Mood normal.        Behavior: Behavior normal.        Thought Content: Thought content  normal.        Judgment: Judgment normal.         Assessment & Plan:         1. Chronic obstructive pulmonary disease, unspecified COPD type [facility]) - Mr. Hosmer will continue using Albuterol as needed for shortness of breath, Advair 1 puff bid and Incruse ellipta 1 puff daily, and he will follow up at medication management with the status of Spiriva.  2. Lipids abnormal - He continues to take 20 mg of Simvastatin and last  Total cholesterol on 09/19/16 was 202 mg/dl, Triglyceride was 242 mg/dl . - Lipid Profile;was collected during visit.  3. Health care maintenance - Influenza vaccine was administered today - CBC w/Diff; Future - Comp Met (CMET); Future - Urinalysis; Future - HgB A1c; Future - Urine Microalbumin w/creat. ratio; Future - Ophthalmology exam was scheduled - No palpable knot to left wrist, he was advised to notify provider for any eruption or wrist pain. - He was advised to complete charity care application for dermatology referral for facial mole removal. - Dental referral  4. Smoking history - He was advised on smoking cessation - Follow up in 3 months    [place], NP   [facility]

## 2018-08-20 NOTE — Patient Instructions (Signed)

## 2018-08-21 LAB — URINALYSIS
Bilirubin, UA: NEGATIVE
GLUCOSE, UA: NEGATIVE
Leukocytes, UA: NEGATIVE
Nitrite, UA: NEGATIVE
RBC, UA: NEGATIVE
Specific Gravity, UA: >=1.03 — AB (ref 1.005–1.030)
Urobilinogen, Ur: 0.2 mg/dL (ref 0.2–1.0)
pH, UA: 5.5 (ref 5.0–7.5)

## 2018-08-21 LAB — CBC WITH DIFFERENTIAL/PLATELET
Basophils Absolute: 0.1 10*3/uL (ref 0.0–0.2)
Basos: 0 %
EOS (ABSOLUTE): 0.1 10*3/uL (ref 0.0–0.4)
Eos: 1 %
Hematocrit: 39.5 % (ref 37.5–51.0)
Hemoglobin: 13.4 g/dL (ref 13.0–17.7)
Immature Grans (Abs): 0.1 10*3/uL (ref 0.0–0.1)
Immature Granulocytes: 1 %
Lymphocytes Absolute: 1.3 10*3/uL (ref 0.7–3.1)
Lymphs: 11 %
MCH: 30.2 pg (ref 26.6–33.0)
MCHC: 33.9 g/dL (ref 31.5–35.7)
MCV: 89 fL (ref 79–97)
Monocytes Absolute: 1.2 10*3/uL — ABNORMAL HIGH (ref 0.1–0.9)
Monocytes: 10 %
Neutrophils Absolute: 9.4 10*3/uL — ABNORMAL HIGH (ref 1.4–7.0)
Neutrophils: 77 %
PLATELETS: 465 10*3/uL — AB (ref 150–450)
RBC: 4.44 x10E6/uL (ref 4.14–5.80)
RDW: 13.3 % (ref 11.6–15.4)
WBC: 12.1 10*3/uL — ABNORMAL HIGH (ref 3.4–10.8)

## 2018-08-21 LAB — MICROALBUMIN / CREATININE URINE RATIO
Creatinine, Urine: 466.3 mg/dL
MICROALBUM., U, RANDOM: 32.4 ug/mL
Microalb/Creat Ratio: 6.9 mg/g creat (ref 0.0–30.0)

## 2018-08-21 LAB — COMPREHENSIVE METABOLIC PANEL
ALBUMIN: 4.1 g/dL (ref 3.6–4.8)
ALT: 39 IU/L (ref 0–44)
AST: 22 IU/L (ref 0–40)
Albumin/Globulin Ratio: 1.5 (ref 1.2–2.2)
Alkaline Phosphatase: 71 IU/L (ref 39–117)
BUN/Creatinine Ratio: 19 (ref 10–24)
BUN: 16 mg/dL (ref 8–27)
Bilirubin Total: 0.2 mg/dL (ref 0.0–1.2)
CALCIUM: 9.6 mg/dL (ref 8.6–10.2)
CO2: 22 mmol/L (ref 20–29)
Chloride: 101 mmol/L (ref 96–106)
Creatinine, Ser: 0.85 mg/dL (ref 0.76–1.27)
GFR calc Af Amer: 106 mL/min/{1.73_m2} (ref >59)
GFR, EST NON AFRICAN AMERICAN: 92 mL/min/{1.73_m2} (ref >59)
Globulin, Total: 2.7 g/dL (ref 1.5–4.5)
Glucose: 80 mg/dL (ref 65–99)
Potassium: 4.3 mmol/L (ref 3.5–5.2)
Sodium: 140 mmol/L (ref 134–144)
TOTAL PROTEIN: 6.8 g/dL (ref 6.0–8.5)

## 2018-08-21 LAB — LIPID PANEL
Chol/HDL Ratio: 4.4 ratio (ref 0.0–5.0)
Cholesterol, Total: 208 mg/dL — ABNORMAL HIGH (ref 100–199)
HDL: 47 mg/dL (ref >39)
LDL Calculated: 90 mg/dL (ref 0–99)
Triglycerides: 353 mg/dL — ABNORMAL HIGH (ref 0–149)
VLDL Cholesterol Cal: 71 mg/dL — ABNORMAL HIGH (ref 5–40)

## 2018-08-21 LAB — HEMOGLOBIN A1C
Est. average glucose Bld gHb Est-mCnc: 131 mg/dL
Hgb A1c MFr Bld: 6.2 % — ABNORMAL HIGH (ref 4.8–5.6)

## 2018-09-03 ENCOUNTER — Ambulatory Visit: Payer: Self-pay | Admitting: Ophthalmology

## 2018-09-08 ENCOUNTER — Telehealth: Payer: Self-pay | Admitting: Adult Health Nurse Practitioner

## 2018-09-08 NOTE — Telephone Encounter (Signed)
Called pt to reschedule [facility] lab apt on 09/10/2018 from 3:00pm to an earlier time between 10:30 and 2:30 on the same day

## 2018-09-10 ENCOUNTER — Ambulatory Visit: Payer: Self-pay | Admitting: Ophthalmology

## 2018-09-10 ENCOUNTER — Other Ambulatory Visit: Payer: Self-pay

## 2018-09-17 ENCOUNTER — Other Ambulatory Visit: Payer: Self-pay

## 2018-09-17 ENCOUNTER — Ambulatory Visit: Payer: Self-pay | Admitting: Ophthalmology

## 2018-09-24 ENCOUNTER — Other Ambulatory Visit: Payer: Self-pay

## 2018-09-29 ENCOUNTER — Telehealth: Payer: Self-pay | Admitting: Pharmacist

## 2018-09-29 NOTE — Telephone Encounter (Signed)
09/29/2018 1:49:01 PM - Ventolin refill  09/29/2018 Requested refill online with [facility] for Ventolin HFA, to ship 10/15/2018, order# [id number].Antonio Gonzalez

## 2018-10-08 ENCOUNTER — Other Ambulatory Visit: Payer: Self-pay

## 2018-10-08 ENCOUNTER — Ambulatory Visit: Payer: Self-pay | Admitting: Ophthalmology

## 2018-10-08 DIAGNOSIS — Z0000 Encounter for general adult medical examination without abnormal findings: Secondary | ICD-10-CM

## 2018-10-09 LAB — CBC WITH DIFFERENTIAL/PLATELET
BASOS: 1 %
Basophils Absolute: 0.1 10*3/uL (ref 0.0–0.2)
EOS (ABSOLUTE): 0.2 10*3/uL (ref 0.0–0.4)
Eos: 2 %
Hematocrit: 45.1 % (ref 37.5–51.0)
Hemoglobin: 15.5 g/dL (ref 13.0–17.7)
Immature Grans (Abs): 0 10*3/uL (ref 0.0–0.1)
Immature Granulocytes: 0 %
Lymphocytes Absolute: 1.7 10*3/uL (ref 0.7–3.1)
Lymphs: 14 %
MCH: 31.1 pg (ref 26.6–33.0)
MCHC: 34.4 g/dL (ref 31.5–35.7)
MCV: 90 fL (ref 79–97)
Monocytes Absolute: 1.1 10*3/uL — ABNORMAL HIGH (ref 0.1–0.9)
Monocytes: 9 %
NEUTROS ABS: 9.4 10*3/uL — AB (ref 1.4–7.0)
Neutrophils: 74 %
Platelets: 304 10*3/uL (ref 150–450)
RBC: 4.99 x10E6/uL (ref 4.14–5.80)
RDW: 14.8 % (ref 11.6–15.4)
WBC: 12.5 10*3/uL — ABNORMAL HIGH (ref 3.4–10.8)

## 2018-10-12 ENCOUNTER — Telehealth: Payer: Self-pay | Admitting: Pharmacist

## 2018-10-12 NOTE — Telephone Encounter (Signed)
10/12/2018 1:44:51 PM - Incruse Ellipta refill  10/12/2018 Placed refill online with [facility] for Incruse Ellipta to release 11/16/2018, Order# [id number].Delos Haring

## 2018-10-26 ENCOUNTER — Telehealth: Payer: Self-pay | Admitting: Pharmacist

## 2018-10-26 NOTE — Telephone Encounter (Signed)
10/26/2018 3:32:43 PM - Advair 250/50 refill  10/26/2018 Placed refill for Advair 250/50 online with [facility], order# [id number].Delos Haring

## 2018-11-18 NOTE — Progress Notes (Signed)
Established Patient Office Visit  Subjective:  Patient ID: Antonio Gonzalez, male    DOB: 12/13/1955  Age: 63 y.o. MRN: [id number]  CC:  Chief Complaint  Patient presents with  . Follow-up    COPD    HPI Antonio Gonzalez presents for follow up for COPD, hyperlipidemia, left wrist pain and mole.  Patient consents to telephone visit. 2 patient identifier was used to identify patient.  COPD: He presents for f/u for copd and reports that his breathing has been stable with current regimen. He does not wake up at night for SOB, and his last exacerbation was 07/18/18. He reports that he uses Advair, Incruse Ellipta and albuterol as needed. His Total cholesterol 208, HDL 47, LDL 90, Triglycerides 353 and he takes 20 mg simvastatin.  He denies myalgia or weakness.  He continues to work on lifestyle modifications. He reports that the 2 moles to the right temporal side of his head has not changed in size, color and it's not painful, though it's inconviniencing. He states that he does not need to see a dermatologist. He reports that he's having some difficulties extending his left index finger that was fractured above the knuckle 3-4 years ago. He also c/o experiencing intermittent throbbing 5/10 non radiating pain to his left wrist. He takes Naproxen 500 mg as needed with relief. He states that the cyst to the left wrist has not increased in size but it's painful sometimes. His [facility] charity care has being approved and he states that his Orthopedic appointment was canceled due to the pandemic. Marland KitchenHis WBC this year was 12.1 and 12.5 and Neutrophils was 9.4, he denies recent infection,  fever,chills, night sweats, and weight loss,chest pain,palpitation and no further concerns.       Past Medical History:  Diagnosis Date  . COPD (chronic obstructive pulmonary disease) ([place])   . HLD (hyperlipidemia)     Past Surgical History:  Procedure Laterality Date  . NO PAST SURGERIES      Family History  Problem  Relation Age of Onset  . Diabetes type II Mother   . Diabetes type II Maternal Grandmother     Social History   Socioeconomic History  . Marital status: Single    Spouse name: Not on file  . Number of children: Not on file  . Years of education: Not on file  . Highest education level: Not on file  Occupational History  . Not on file  Social Needs  . Financial resource strain: Hard  . Food insecurity:    Worry: Often true    Inability: Often true  . Transportation needs:    Medical: No    Non-medical: No  Tobacco Use  . Smoking status: Current Every Day Smoker    Packs/day: 0.25    Years: 40.00    Pack years: 10.00    Types: Cigarettes  . Smokeless tobacco: Current User  Substance and Sexual Activity  . Alcohol use: Yes    Comment: seldom  . Drug use: Not on file  . Sexual activity: Not on file  Lifestyle  . Physical activity:    Days per week: 0 days    Minutes per session: 0 min  . Stress: Very much  Relationships  . Social connections:    Talks on phone: Twice a week    Gets together: Never    Attends religious service: Never    Active member of club or organization: No    Attends meetings of clubs or organizations:  Never    Relationship status: Never married  . Intimate partner violence:    Fear of current or ex partner: No    Emotionally abused: No    Physically abused: No    Forced sexual activity: No  Other Topics Concern  . Not on file  Social History Narrative  . Not on file    Outpatient Medications Prior to Visit  Medication Sig Dispense Refill  . albuterol (PROVENTIL HFA;VENTOLIN HFA) 108 (90 Base) MCG/ACT inhaler Inhale 2 puffs into the lungs every 6 (six) hours as needed for wheezing or shortness of breath. 3 Inhaler 2  . Fluticasone-Salmeterol (ADVAIR DISKUS) 250-50 MCG/DOSE AEPB Inhale 1 puff into the lungs 2 (two) times daily. 60 each 3  . levofloxacin (LEVAQUIN) 750 MG tablet Take 1 tablet (750 mg total) by mouth daily. (Patient not  taking: Reported on 08/20/2018) 5 tablet 0  . naproxen (NAPROSYN) 500 MG tablet TAKE 1 TABLET BY MOUTH 2 TIMES A DAY WITH FOOD 60 tablet 0  . simvastatin (ZOCOR) 20 MG tablet TAKE ONE TABLET BY MOUTH EVERY EVENING 90 tablet 0  . tiotropium (SPIRIVA) 18 MCG inhalation capsule Place 18 mcg into inhaler and inhale daily.     No facility-administered medications prior to visit.     No Known Allergies  ROS Review of Systems  Constitutional: Negative for chills and fever.  HENT: Negative.   Respiratory: Negative.  Negative for shortness of breath.   Cardiovascular: Negative.   Gastrointestinal: Negative.   Genitourinary: Negative.   Musculoskeletal: Positive for arthralgias (chronic intermittent throbbing pain to left wrist).  Skin: Negative.   Psychiatric/Behavioral: Negative.       Objective:    Physical Exam   No vital sign and PE done.  There were no vitals taken for this visit. Wt Readings from Last 3 Encounters:  08/20/18 242 lb 1.6 oz (109.8 kg)  07/16/18 235 lb (106.6 kg)  05/05/18 233 lb 4.8 oz (105.8 kg)     Health Maintenance Due  Topic Date Due  . Hepatitis C Screening  03-Oct-1955  . FOOT EXAM  08/09/1964  . OPHTHALMOLOGY EXAM  08/09/1964  . TETANUS/TDAP  08/09/1973  . COLONOSCOPY  08/09/2004    There are no preventive care reminders to display for this patient.  No results found for: TSH Lab Results  Component Value Date   WBC 12.5 (H) 10/08/2018   HGB 15.5 10/08/2018   HCT 45.1 10/08/2018   MCV 90 10/08/2018   PLT 304 10/08/2018   Lab Results  Component Value Date   NA 140 08/20/2018   K 4.3 08/20/2018   CO2 22 08/20/2018   GLUCOSE 80 08/20/2018   BUN 16 08/20/2018   CREATININE 0.85 08/20/2018   BILITOT 0.2 08/20/2018   ALKPHOS 71 08/20/2018   AST 22 08/20/2018   ALT 39 08/20/2018   PROT 6.8 08/20/2018   ALBUMIN 4.1 08/20/2018   CALCIUM 9.6 08/20/2018   ANIONGAP 8 07/18/2018   Lab Results  Component Value Date   CHOL 208 (H)  08/20/2018   Lab Results  Component Value Date   HDL 47 08/20/2018   Lab Results  Component Value Date   LDLCALC 90 08/20/2018   Lab Results  Component Value Date   TRIG 353 (H) 08/20/2018   Lab Results  Component Value Date   CHOLHDL 4.4 08/20/2018   Lab Results  Component Value Date   HGBA1C 6.2 (H) 08/20/2018      Assessment & Plan:  1. Chronic obstructive pulmonary disease, unspecified COPD type ([place]) - He will continue on current treatment regimen. - umeclidinium bromide (INCRUSE ELLIPTA) 62.5 MCG/INH 1 puff  2. Lipids abnormal - Continue on current treatment regimen. - simvastatin (ZOCOR) 20 MG tablet; Take 1 tablet (20 mg total) by mouth every evening.  Dispense: 90 tablet; Refill: 0 . -Low fat Diet, like low fat dairy products eg skimmed milk . Avoid any fried food . Regular exercise/walk . Goal for Total Cholesterol is less than 200 . Goal for bad cholesterol LDL is less than 70 . Goal for Good cholesterol HDL is more than 45 . Goal for Triglyceride is less than 150   3. Abnormal white blood cell (WBC)/Neutrophil - Ddx Leukocytosis ? inflammatory reaction due to left wrist discomfort. -Will recheck CBC w/Diff in 5 weeks. Advised patient to notify clinic and go to the emergency room for fever , chills or any signs of infection.  4. Pain in left wrist - He was advised to take OTC tylenol, and go to the emergency room for worsening pain. - He will [facility] with [facility]  5. Skin mole - He was advised to be checking mole and notify clinic for change in size, color or pain. - He states that he doesn't want Dermatology appointment yet.   Follow-up: Return in about 5 weeks (around 12/24/2018), or if symptoms worsen or fail to improve.    Mirella Gueye Jerold Coombe, NP

## 2018-11-19 ENCOUNTER — Ambulatory Visit: Payer: Self-pay | Admitting: Gerontology

## 2018-11-19 ENCOUNTER — Other Ambulatory Visit: Payer: Self-pay

## 2018-11-19 DIAGNOSIS — D729 Disorder of white blood cells, unspecified: Secondary | ICD-10-CM

## 2018-11-19 DIAGNOSIS — E7889 Other lipoprotein metabolism disorders: Secondary | ICD-10-CM

## 2018-11-19 DIAGNOSIS — J449 Chronic obstructive pulmonary disease, unspecified: Secondary | ICD-10-CM

## 2018-11-19 DIAGNOSIS — M25532 Pain in left wrist: Secondary | ICD-10-CM

## 2018-11-19 DIAGNOSIS — D229 Melanocytic nevi, unspecified: Secondary | ICD-10-CM

## 2018-11-19 MED ORDER — UMECLIDINIUM BROMIDE 62.5 MCG/INH IN AEPB: 1.0000 | INHALATION_SPRAY | Freq: Every day | RESPIRATORY_TRACT | Status: DC

## 2018-11-19 MED ORDER — NAPROXEN 500 MG PO TABS: 500.0000 mg | ORAL_TABLET | Freq: Every day | ORAL | 0 refills | Status: DC

## 2018-11-19 MED ORDER — SIMVASTATIN 20 MG PO TABS: 20.0000 mg | ORAL_TABLET | Freq: Every evening | ORAL | 0 refills | Status: DC

## 2018-12-24 ENCOUNTER — Telehealth: Payer: Self-pay | Admitting: Pharmacist

## 2018-12-24 NOTE — Telephone Encounter (Signed)
12/24/2018 2:27:17 PM - Ventolin script to [facility]  12/24/2018 I have printed script for Ventolin HFA 28mcg Inhale 2 puffs into the lungs every 6 hours as needed for wheezing or shortness of breath; taking to [facility] for provider to sign for refill.Delos Haring

## 2018-12-31 ENCOUNTER — Telehealth: Payer: Self-pay | Admitting: Pharmacist

## 2018-12-31 NOTE — Telephone Encounter (Signed)
12/31/2018 9:01:05 AM - [facility] Ellipta refill  12/31/2018 Placed refill online with [facility] for Incruse Ellipta 62.26mcg, to ship 01/13/2019, order# [id number].Delos Haring

## 2019-01-01 ENCOUNTER — Telehealth: Payer: Self-pay | Admitting: Pharmacist

## 2019-01-01 NOTE — Telephone Encounter (Signed)
01/01/2019 9:36:09 AM - Ventolin HFA refill  01/01/2019 Faxed Ventolin HFA 42mcg Inhale 2 puffs into the lungs every 6 hours as needed for wheezing or shortness of breath--script to [facility] for refill.Delos Haring

## 2019-01-06 ENCOUNTER — Other Ambulatory Visit: Payer: Self-pay

## 2019-01-13 ENCOUNTER — Ambulatory Visit: Payer: Self-pay | Admitting: Gerontology

## 2019-01-20 ENCOUNTER — Other Ambulatory Visit: Payer: Self-pay

## 2019-01-20 DIAGNOSIS — D729 Disorder of white blood cells, unspecified: Secondary | ICD-10-CM

## 2019-01-21 ENCOUNTER — Ambulatory Visit: Payer: Self-pay | Admitting: Gerontology

## 2019-01-21 ENCOUNTER — Other Ambulatory Visit: Payer: Self-pay

## 2019-01-21 DIAGNOSIS — E785 Hyperlipidemia, unspecified: Secondary | ICD-10-CM

## 2019-01-21 DIAGNOSIS — R519 Headache, unspecified: Secondary | ICD-10-CM | POA: Insufficient documentation

## 2019-01-21 DIAGNOSIS — D229 Melanocytic nevi, unspecified: Secondary | ICD-10-CM

## 2019-01-21 DIAGNOSIS — R7303 Prediabetes: Secondary | ICD-10-CM

## 2019-01-21 DIAGNOSIS — J449 Chronic obstructive pulmonary disease, unspecified: Secondary | ICD-10-CM

## 2019-01-21 DIAGNOSIS — M25532 Pain in left wrist: Secondary | ICD-10-CM | POA: Insufficient documentation

## 2019-01-21 LAB — CBC WITH DIFFERENTIAL/PLATELET
Basophils Absolute: 0.1 10*3/uL (ref 0.0–0.2)
Basos: 1 %
EOS (ABSOLUTE): 0.2 10*3/uL (ref 0.0–0.4)
Eos: 2 %
Hematocrit: 43.4 % (ref 37.5–51.0)
Hemoglobin: 14.6 g/dL (ref 13.0–17.7)
Immature Grans (Abs): 0 10*3/uL (ref 0.0–0.1)
Immature Granulocytes: 0 %
Lymphocytes Absolute: 1.5 10*3/uL (ref 0.7–3.1)
Lymphs: 17 %
MCH: 30.5 pg (ref 26.6–33.0)
MCHC: 33.6 g/dL (ref 31.5–35.7)
MCV: 91 fL (ref 79–97)
Monocytes Absolute: 0.7 10*3/uL (ref 0.1–0.9)
Monocytes: 8 %
Neutrophils Absolute: 6.2 10*3/uL (ref 1.4–7.0)
Neutrophils: 72 %
Platelets: 276 10*3/uL (ref 150–450)
RBC: 4.79 x10E6/uL (ref 4.14–5.80)
RDW: 14.4 % (ref 11.6–15.4)
WBC: 8.6 10*3/uL (ref 3.4–10.8)

## 2019-01-21 NOTE — Progress Notes (Signed)
Established Patient Office Visit  Subjective:  Patient ID: Antonio Gonzalez, male    DOB: April 03, 1956  Age: 63 y.o. MRN: [id number]  CC:  Chief Complaint  Patient presents with  . Follow-up    COPD  Patient consents to telephone visit and 2 patient identifier was used to identify patient.  HPI Antonio Gonzalez presents for follow up COPD, hyperlipidemia, left wrist pain and right temporal mole. He reports that his breathing has been stable with current regimen. He does not wake up at night for SOB, and his last exacerbation was 07/18/18. He reports that he uses Advair, Incruse Ellipta and albuterol as needed. He continues to take 20 mg Simvastatin and denies myalgia , weakness and continues to make lifestyle modifications. He reports experiencing mild intermittent non radiating dull headache to the temporal side of his head. He states that headache doesn't last long and resolves with taking low dose aspirin. He reports that the 2 moles to the right temporal side of his head has not changed in size, color and it's not painful when touched, but it is not pleasing to look at, and he request dermatology referral . He reports that he's having some difficulties extending his left index finger that was fractured above the knuckle 3-4 years ago. He states that his left wrist has not bordered him as much, but he experiences intermittent non radiating 5/10 throbbing  pain sometimes. He takes Naproxen 500 mg as needed with relief. He states that the cyst to the left wrist has not increased in size but it's painful sometimes. His [facility] charity care has being approved and he states that his Orthopedic appointment was canceled due to the pandemic. His CBC done on 01/20/19 was within normal limits, he denies chest pain,palpitation, fever, chills and no further concerns.      Past Medical History:  Diagnosis Date  . COPD (chronic obstructive pulmonary disease) ([place])   . HLD (hyperlipidemia)     Past Surgical  History:  Procedure Laterality Date  . NO PAST SURGERIES      Family History  Problem Relation Age of Onset  . Diabetes type II Mother   . Diabetes type II Maternal Grandmother     Social History   Socioeconomic History  . Marital status: Single    Spouse name: Not on file  . Number of children: Not on file  . Years of education: Not on file  . Highest education level: Not on file  Occupational History  . Not on file  Social Needs  . Financial resource strain: Hard  . Food insecurity    Worry: Often true    Inability: Often true  . Transportation needs    Medical: No    Non-medical: No  Tobacco Use  . Smoking status: Current Every Day Smoker    Packs/day: 0.25    Years: 40.00    Pack years: 10.00    Types: Cigarettes  . Smokeless tobacco: Current User  Substance and Sexual Activity  . Alcohol use: Yes    Comment: seldom  . Drug use: Not on file  . Sexual activity: Not on file  Lifestyle  . Physical activity    Days per week: 0 days    Minutes per session: 0 min  . Stress: Very much  Relationships  . Social Herbalist on phone: Twice a week    Gets together: Never    Attends religious service: Never    Active member of club or  organization: No    Attends meetings of clubs or organizations: Never    Relationship status: Never married  . Intimate partner violence    Fear of current or ex partner: No    Emotionally abused: No    Physically abused: No    Forced sexual activity: No  Other Topics Concern  . Not on file  Social History Narrative  . Not on file    Outpatient Medications Prior to Visit  Medication Sig Dispense Refill  . albuterol (PROVENTIL HFA;VENTOLIN HFA) 108 (90 Base) MCG/ACT inhaler Inhale 2 puffs into the lungs every 6 (six) hours as needed for wheezing or shortness of breath. 3 Inhaler 2  . Fluticasone-Salmeterol (ADVAIR DISKUS) 250-50 MCG/DOSE AEPB Inhale 1 puff into the lungs 2 (two) times daily. 60 each 3  . simvastatin  (ZOCOR) 20 MG tablet Take 1 tablet (20 mg total) by mouth every evening. 90 tablet 0  . umeclidinium bromide (INCRUSE ELLIPTA) 62.5 MCG/INH AEPB Inhale 1 puff into the lungs daily.    . naproxen (NAPROSYN) 500 MG tablet Take 1 tablet (500 mg total) by mouth daily. 30 tablet 0  . levofloxacin (LEVAQUIN) 750 MG tablet Take 1 tablet (750 mg total) by mouth daily. (Patient not taking: Reported on 08/20/2018) 5 tablet 0  . umeclidinium bromide (INCRUSE ELLIPTA) 62.5 MCG/INH 1 puff      No facility-administered medications prior to visit.     No Known Allergies  ROS Review of Systems  Constitutional: Negative.   Respiratory: Positive for shortness of breath (intermittent ).   Cardiovascular: Negative.   Gastrointestinal: Negative.   Genitourinary: Negative.   Musculoskeletal: Positive for arthralgias (chronic left wrist pain).  Skin:       Mole to right temporal side of head  Neurological: Positive for headaches (mild intermittent headache).  Psychiatric/Behavioral: Negative.       Objective:    Physical Exam No vital sign or PE was done There were no vitals taken for this visit. Wt Readings from Last 3 Encounters:  08/20/18 242 lb 1.6 oz (109.8 kg)  07/16/18 235 lb (106.6 kg)  05/05/18 233 lb 4.8 oz (105.8 kg)     Health Maintenance Due  Topic Date Due  . Hepatitis C Screening  1955-08-29  . FOOT EXAM  08/09/1964  . OPHTHALMOLOGY EXAM  08/09/1964  . TETANUS/TDAP  08/09/1973  . COLONOSCOPY  08/09/2004    There are no preventive care reminders to display for this patient.  No results found for: TSH Lab Results  Component Value Date   WBC 8.6 01/20/2019   HGB 14.6 01/20/2019   HCT 43.4 01/20/2019   MCV 91 01/20/2019   PLT 276 01/20/2019   Lab Results  Component Value Date   NA 140 08/20/2018   K 4.3 08/20/2018   CO2 22 08/20/2018   GLUCOSE 80 08/20/2018   BUN 16 08/20/2018   CREATININE 0.85 08/20/2018   BILITOT 0.2 08/20/2018   ALKPHOS 71 08/20/2018   AST 22  08/20/2018   ALT 39 08/20/2018   PROT 6.8 08/20/2018   ALBUMIN 4.1 08/20/2018   CALCIUM 9.6 08/20/2018   ANIONGAP 8 07/18/2018   Lab Results  Component Value Date   CHOL 208 (H) 08/20/2018   Lab Results  Component Value Date   HDL 47 08/20/2018   Lab Results  Component Value Date   LDLCALC 90 08/20/2018   Lab Results  Component Value Date   TRIG 353 (H) 08/20/2018   Lab Results  Component Value  Date   CHOLHDL 4.4 08/20/2018   Lab Results  Component Value Date   HGBA1C 6.2 (H) 08/20/2018      Assessment & Plan:     1. Chronic obstructive pulmonary disease, unspecified COPD type ([place]) - He will continue on current treatment regimen  2. Hyperlipidemia, unspecified hyperlipidemia type - He will continue on current treatment regimen --Low fat Diet, like low fat dairy products eg skimmed milk -Avoid any fried food -Regular exercise/walk as tolerated -Goal for Total Cholesterol is less than 200 -Goal for bad cholesterol LDL is less than 100 -Goal for Good cholesterol HDL is more than 45 -Goal for Triglyceride is less than 150 - Comp Met (CMET); Future - Lipid panel; Future  3. Skin mole - He was advised to notify clinic of any changes in the size and color of the mole. - Ambulatory referral to Dermatology  4. Pain in left wrist - He will continue on Naproxen 500 mg as needed and was advised to notify clinic or go to the ED for worsening left wrist pain. - Ambulatory referral to Orthopedic Surgery  5. Prediabetes - He was advised to continue on low carb/non concentrated sweet diet, exercise as tolerated. Will recheck HgbA1c. - Comp Met (CMET); Future - HgB A1c; Future  6. Nonintractable headache, unspecified chronicity pattern, unspecified headache type - He was advised to take tylenol 650 mg every 6 hours for headache and not to exceed 4000 mg daily, and to notify clinic or go to the ED for worsening headache.    Follow-up: Return in about 7 weeks  (around 03/11/2019), or if symptoms worsen or fail to improve.    Creasie Lacosse Jerold Coombe, NP

## 2019-01-29 ENCOUNTER — Telehealth: Payer: Self-pay | Admitting: Pharmacist

## 2019-01-29 NOTE — Telephone Encounter (Signed)
01/29/2019 2:24:51 PM - Ventolin HFA refill  01/29/2019 ordered refill online with [facility] for Ventolin HFA, to ship 02/19/2019, order# [id number].Delos Haring

## 2019-02-01 ENCOUNTER — Telehealth: Payer: Self-pay | Admitting: Pharmacy Technician

## 2019-02-01 NOTE — Telephone Encounter (Signed)
Received 2020 proof of income.  Patient eligible to receive medication assistance at [facility] until 08/06/19.  Will have Medicare beginning 08/06/2019.  Patient understands that he must sign-up for a Part D plan and that [facility] will no longer be able to provide medication assistance once he obtains a Part D plan.  [facility]

## 2019-02-26 ENCOUNTER — Other Ambulatory Visit: Payer: Self-pay | Admitting: Gerontology

## 2019-02-26 DIAGNOSIS — E7889 Other lipoprotein metabolism disorders: Secondary | ICD-10-CM

## 2019-03-03 ENCOUNTER — Other Ambulatory Visit: Payer: Self-pay

## 2019-03-09 ENCOUNTER — Ambulatory Visit: Payer: Self-pay | Admitting: Gerontology

## 2019-03-17 ENCOUNTER — Other Ambulatory Visit: Payer: Self-pay

## 2019-03-17 DIAGNOSIS — E785 Hyperlipidemia, unspecified: Secondary | ICD-10-CM

## 2019-03-17 DIAGNOSIS — R7303 Prediabetes: Secondary | ICD-10-CM

## 2019-03-18 LAB — LIPID PANEL
Chol/HDL Ratio: 4.2 ratio (ref 0.0–5.0)
Cholesterol, Total: 222 mg/dL — ABNORMAL HIGH (ref 100–199)
HDL: 53 mg/dL (ref >39)
LDL Calculated: 107 mg/dL — ABNORMAL HIGH (ref 0–99)
Triglycerides: 308 mg/dL — ABNORMAL HIGH (ref 0–149)
VLDL Cholesterol Cal: 62 mg/dL — ABNORMAL HIGH (ref 5–40)

## 2019-03-18 LAB — COMPREHENSIVE METABOLIC PANEL
ALT: 36 IU/L (ref 0–44)
AST: 27 IU/L (ref 0–40)
Albumin/Globulin Ratio: 2.2 (ref 1.2–2.2)
Albumin: 4.6 g/dL (ref 3.8–4.8)
Alkaline Phosphatase: 62 IU/L (ref 39–117)
BUN/Creatinine Ratio: 9 — ABNORMAL LOW (ref 10–24)
BUN: 8 mg/dL (ref 8–27)
Bilirubin Total: 0.3 mg/dL (ref 0.0–1.2)
CO2: 24 mmol/L (ref 20–29)
Calcium: 9.3 mg/dL (ref 8.6–10.2)
Chloride: 98 mmol/L (ref 96–106)
Creatinine, Ser: 0.85 mg/dL (ref 0.76–1.27)
GFR calc Af Amer: 106 mL/min/{1.73_m2} (ref >59)
GFR calc non Af Amer: 92 mL/min/{1.73_m2} (ref >59)
Globulin, Total: 2.1 g/dL (ref 1.5–4.5)
Glucose: 162 mg/dL — ABNORMAL HIGH (ref 65–99)
Potassium: 4.5 mmol/L (ref 3.5–5.2)
Sodium: 138 mmol/L (ref 134–144)
Total Protein: 6.7 g/dL (ref 6.0–8.5)

## 2019-03-18 LAB — HEMOGLOBIN A1C
Est. average glucose Bld gHb Est-mCnc: 128 mg/dL
Hgb A1c MFr Bld: 6.1 % — ABNORMAL HIGH (ref 4.8–5.6)

## 2019-03-22 ENCOUNTER — Other Ambulatory Visit: Payer: Self-pay

## 2019-03-22 ENCOUNTER — Ambulatory Visit: Payer: Self-pay | Admitting: Gerontology

## 2019-03-22 DIAGNOSIS — M25842 Other specified joint disorders, left hand: Secondary | ICD-10-CM

## 2019-03-22 DIAGNOSIS — E785 Hyperlipidemia, unspecified: Secondary | ICD-10-CM

## 2019-03-22 DIAGNOSIS — J449 Chronic obstructive pulmonary disease, unspecified: Secondary | ICD-10-CM

## 2019-03-22 DIAGNOSIS — R7303 Prediabetes: Secondary | ICD-10-CM | POA: Insufficient documentation

## 2019-03-22 MED ORDER — METFORMIN HCL 1000 MG PO TABS: 500.0000 mg | ORAL_TABLET | Freq: Every day | ORAL | 0 refills | Status: DC

## 2019-03-22 MED ORDER — INCRUSE ELLIPTA 62.5 MCG/INH IN AEPB: 1.0000 | INHALATION_SPRAY | Freq: Every day | RESPIRATORY_TRACT | 3 refills | Status: DC

## 2019-03-22 NOTE — Patient Instructions (Signed)
Carbohydrate Counting for Diabetes Mellitus, Adult  Carbohydrate counting is a method of keeping track of how many carbohydrates you eat. Eating carbohydrates naturally increases the amount of sugar (glucose) in the blood. Counting how many carbohydrates you eat helps keep your blood glucose within normal limits, which helps you manage your diabetes (diabetes mellitus). It is important to know how many carbohydrates you can safely have in each meal. This is different for every person. A diet and nutrition specialist (registered dietitian) can help you make a meal plan and calculate how many carbohydrates you should have at each meal and snack. Carbohydrates are found in the following foods:  Grains, such as breads and cereals.  Dried beans and soy products.  Starchy vegetables, such as potatoes, peas, and corn.  Fruit and fruit juices.  Milk and yogurt.  Sweets and snack foods, such as cake, cookies, candy, chips, and soft drinks. How do I count carbohydrates? There are two ways to count carbohydrates in food. You can use either of the methods or a combination of both. Reading "Nutrition Facts" on packaged food The "Nutrition Facts" list is included on the labels of almost all packaged foods and beverages in the [place] It includes:  The serving size.  Information about nutrients in each serving, including the grams (g) of carbohydrate per serving. To use the "Nutrition Facts":  Decide how many servings you will have.  Multiply the number of servings by the number of carbohydrates per serving.  The resulting number is the total amount of carbohydrates that you will be having. Learning standard serving sizes of other foods When you eat carbohydrate foods that are not packaged or do not include "Nutrition Facts" on the label, you need to measure the servings in order to count the amount of carbohydrates:  Measure the foods that you will eat with a food scale or measuring cup, if needed.   Decide how many standard-size servings you will eat.  Multiply the number of servings by 15. Most carbohydrate-rich foods have about 15 g of carbohydrates per serving. ? For example, if you eat 8 oz (170 g) of strawberries, you will have eaten 2 servings and 30 g of carbohydrates (2 servings x 15 g = 30 g).  For foods that have more than one food mixed, such as soups and casseroles, you must count the carbohydrates in each food that is included. The following list contains standard serving sizes of common carbohydrate-rich foods. Each of these servings has about 15 g of carbohydrates:   hamburger bun or  English muffin.   oz (15 mL) syrup.   oz (14 g) jelly.  1 slice of bread.  1 six-inch tortilla.  3 oz (85 g) cooked rice or pasta.  4 oz (113 g) cooked dried beans.  4 oz (113 g) starchy vegetable, such as peas, corn, or potatoes.  4 oz (113 g) hot cereal.  4 oz (113 g) mashed potatoes or  of a large baked potato.  4 oz (113 g) canned or frozen fruit.  4 oz (120 mL) fruit juice.  4-6 crackers.  6 chicken nuggets.  6 oz (170 g) unsweetened dry cereal.  6 oz (170 g) plain fat-free yogurt or yogurt sweetened with artificial sweeteners.  8 oz (240 mL) milk.  8 oz (170 g) fresh fruit or one small piece of fruit.  24 oz (680 g) popped popcorn. Example of carbohydrate counting Sample meal  3 oz (85 g) chicken breast.  6 oz (170 g)   brown rice.  4 oz (113 g) corn.  8 oz (240 mL) milk.  8 oz (170 g) strawberries with sugar-free whipped topping. Carbohydrate calculation 1. Identify the foods that contain carbohydrates: ? Rice. ? Corn. ? Milk. ? Strawberries. 2. Calculate how many servings you have of each food: ? 2 servings rice. ? 1 serving corn. ? 1 serving milk. ? 1 serving strawberries. 3. Multiply each number of servings by 15 g: ? 2 servings rice x 15 g = 30 g. ? 1 serving corn x 15 g = 15 g. ? 1 serving milk x 15 g = 15 g. ? 1 serving  strawberries x 15 g = 15 g. 4. Add together all of the amounts to find the total grams of carbohydrates eaten: ? 30 g + 15 g + 15 g + 15 g = 75 g of carbohydrates total. Summary  Carbohydrate counting is a method of keeping track of how many carbohydrates you eat.  Eating carbohydrates naturally increases the amount of sugar (glucose) in the blood.  Counting how many carbohydrates you eat helps keep your blood glucose within normal limits, which helps you manage your diabetes.  A diet and nutrition specialist (registered dietitian) can help you make a meal plan and calculate how many carbohydrates you should have at each meal and snack. This information is not intended to replace advice given to you by your health care provider. Make sure you discuss any questions you have with your health care provider. Document Released: 07/22/2005 Document Revised: 02/13/2017 Document Reviewed: 01/03/2016 Elsevier Patient Education  2020 Elsevier Inc.  

## 2019-03-22 NOTE — Progress Notes (Signed)
Established Patient Office Visit  Subjective:  Patient ID: Antonio Gonzalez, male    DOB: [birth year]  Age: 63 y.o. MRN: [id number]  CC: No chief complaint on file.  Patient consents to telephone visit and 2 patient identifiers was used to identify patient. HPI Antonio Gonzalez presents for follow up COPD, hyperlipidemia, left wrist pain due to presence of cyst, right temporal mole and lab review. He reports that his breathing has been stable with current regimen. He does not wake up at night for SOB, and his last exacerbationwas 07/18/18. He reports that he uses Advair, Incruse Ellipta and albuterol as needed. He continues to smoke 1 pack of cigarette weekly and admits the desire to quit. He reports that the52molestothe righttemporal side of hishead has notchanged in size nor color and it's notpainful when touched. He states that "it is not pleasing to look at" but he wants to wait for sometimes before dermatology referral .   He reports that he's having some difficulties extending his left index finger that was fractured above the knuckle 3-4 years ago, but he reports dealing with the discomfort. He states that the cyst to his left wrist is causing him some pain. Though the cyst has not increased in size, it's painful sometimes when touched and he requests surgical intervention. His  lipid panel done on 03/17/19, total cholesterol 222, Triglycerides 308, and LDL 107 mg/dl, he continues to take 20 mg Simvastatin and denies myalgia , muscle weakness and he is making lifestyle modifications. His HgbA1c was also done 5 days ago and it was 6.1%. he denies chest pain, palpitation, fever, chills and no further concerns.   Past Medical History:  Diagnosis Date  . COPD (chronic obstructive pulmonary disease) ([place])   . HLD (hyperlipidemia)     Past Surgical History:  Procedure Laterality Date  . NO PAST SURGERIES      Family History  Problem Relation Age of Onset  . Diabetes type II Mother   .  Diabetes type II Maternal Grandmother     Social History   Socioeconomic History  . Marital status: Single    Spouse name: Not on file  . Number of children: Not on file  . Years of education: Not on file  . Highest education level: Not on file  Occupational History  . Not on file  Social Needs  . Financial resource strain: Hard  . Food insecurity    Worry: Often true    Inability: Often true  . Transportation needs    Medical: No    Non-medical: No  Tobacco Use  . Smoking status: Current Every Day Smoker    Packs/day: 0.25    Years: 40.00    Pack years: 10.00    Types: Cigarettes  . Smokeless tobacco: Current User  Substance and Sexual Activity  . Alcohol use: Yes    Comment: seldom  . Drug use: Not on file  . Sexual activity: Not on file  Lifestyle  . Physical activity    Days per week: 0 days    Minutes per session: 0 min  . Stress: Very much  Relationships  . Social Herbalist on phone: Twice a week    Gets together: Never    Attends religious service: Never    Active member of club or organization: No    Attends meetings of clubs or organizations: Never    Relationship status: Never married  . Intimate partner violence    Fear of current  or ex partner: No    Emotionally abused: No    Physically abused: No    Forced sexual activity: No  Other Topics Concern  . Not on file  Social History Narrative  . Not on file    Outpatient Medications Prior to Visit  Medication Sig Dispense Refill  . albuterol (PROVENTIL HFA;VENTOLIN HFA) 108 (90 Base) MCG/ACT inhaler Inhale 2 puffs into the lungs every 6 (six) hours as needed for wheezing or shortness of breath. 3 Inhaler 2  . Fluticasone-Salmeterol (ADVAIR DISKUS) 250-50 MCG/DOSE AEPB Inhale 1 puff into the lungs 2 (two) times daily. 60 each 3  . naproxen (NAPROSYN) 500 MG tablet TAKE ONE TABLET BY MOUTH EVERY DAY 30 tablet 0  . simvastatin (ZOCOR) 20 MG tablet TAKE ONE TABLET BY MOUTH EVERY EVENING 90  tablet 0  . umeclidinium bromide (INCRUSE ELLIPTA) 62.5 MCG/INH AEPB Inhale 1 puff into the lungs daily.     No facility-administered medications prior to visit.     No Known Allergies  ROS Review of Systems  Constitutional: Negative.   HENT: Negative.   Eyes: Negative.   Respiratory: Negative.   Cardiovascular: Negative.   Gastrointestinal: Negative.   Endocrine: Negative.   Genitourinary: Negative.   Musculoskeletal: Negative for arthralgias.       Cyst to left wrist  Skin:       2 moles to right temporal side of head  Neurological: Negative.   Psychiatric/Behavioral: Negative.       Objective:    Physical Exam No vital sign or PE was done. There were no vitals taken for this visit. Wt Readings from Last 3 Encounters:  08/20/18 242 lb 1.6 oz (109.8 kg)  07/16/18 235 lb (106.6 kg)  05/05/18 233 lb 4.8 oz (105.8 kg)     Health Maintenance Due  Topic Date Due  . Hepatitis C Screening  [birth year]  . FOOT EXAM  08/09/1964  . OPHTHALMOLOGY EXAM  08/09/1964  . TETANUS/TDAP  08/09/1973  . COLONOSCOPY  08/09/2004  . INFLUENZA VACCINE  03/06/2019    There are no preventive care reminders to display for this patient.  No results found for: TSH Lab Results  Component Value Date   WBC 8.6 01/20/2019   HGB 14.6 01/20/2019   HCT 43.4 01/20/2019   MCV 91 01/20/2019   PLT 276 01/20/2019   Lab Results  Component Value Date   NA 138 03/17/2019   K 4.5 03/17/2019   CO2 24 03/17/2019   GLUCOSE 162 (H) 03/17/2019   BUN 8 03/17/2019   CREATININE 0.85 03/17/2019   BILITOT 0.3 03/17/2019   ALKPHOS 62 03/17/2019   AST 27 03/17/2019   ALT 36 03/17/2019   PROT 6.7 03/17/2019   ALBUMIN 4.6 03/17/2019   CALCIUM 9.3 03/17/2019   ANIONGAP 8 07/18/2018   Lab Results  Component Value Date   CHOL 222 (H) 03/17/2019   Lab Results  Component Value Date   HDL 53 03/17/2019   Lab Results  Component Value Date   LDLCALC 107 (H) 03/17/2019   Lab Results  Component  Value Date   TRIG 308 (H) 03/17/2019   Lab Results  Component Value Date   CHOLHDL 4.2 03/17/2019   Lab Results  Component Value Date   HGBA1C 6.1 (H) 03/17/2019      Assessment & Plan:   1. Chronic obstructive pulmonary disease, unspecified COPD type ([place]) - His COPD is controlled, he will continue on current treatment regimen, and he was encouraged on  smoking cessation. - umeclidinium bromide (INCRUSE ELLIPTA) 62.5 MCG/INH AEPB; Inhale 1 puff into the lungs daily.  Dispense: 1 each; Refill: 3  2. Cyst of joint of hand, left - He states that his [facility] care is active and he prefers to be be referred to [facility]. - Ambulatory referral to General Surgery  3. Hyperlipidemia, unspecified hyperlipidemia type - He will continue on current treatment regimen, and was advised to continue on low fat/low cholesterol diet. -Low fat Diet, like low fat dairy products eg skimmed milk -Avoid any fried food -Regular exercise/walk -Goal for Total Cholesterol is less than 200 -Goal for bad cholesterol LDL is less than 100 -Goal for Good cholesterol HDL is more than 45 -Goal for Triglyceride is less than 150    4. Prediabetes - His HgbA1c was 6.1%, he will start 500 mg Metformin daily. He was advised on the medication side effects. He was also advised to continue on low carb/ non concentrated sweet diet, exercise as tolerated. - metFORMIN (GLUCOPHAGE) 1000 MG tablet; Take 0.5 tablets (500 mg total) by mouth daily with breakfast.  Dispense: 30 tablet; Refill: 0     Follow-up: Return in about 3 weeks (around 04/12/2019), or if symptoms worsen or fail to improve.    Darcelle Herrada Jerold Coombe, NP

## 2019-03-31 ENCOUNTER — Telehealth: Payer: Self-pay | Admitting: Pharmacist

## 2019-03-31 NOTE — Telephone Encounter (Signed)
03/31/2019 10:42:17 AM - [facility] Ellipta & Ventolin refills online  03/31/2019 I have placed refills online with [facility] to ship 04/21/2019, Order# [id number]. [facility] Invoice does have their enrollment ends: 03/24/2019.

## 2019-04-14 ENCOUNTER — Other Ambulatory Visit: Payer: Self-pay

## 2019-04-14 ENCOUNTER — Encounter: Payer: Self-pay | Admitting: Gerontology

## 2019-04-14 ENCOUNTER — Ambulatory Visit: Payer: Self-pay | Admitting: Gerontology

## 2019-04-14 VITALS — BP 121/79 | HR 88 | Temp 97.0°F | Ht 72.0 in | Wt 246.0 lb

## 2019-04-14 DIAGNOSIS — R7303 Prediabetes: Secondary | ICD-10-CM

## 2019-04-14 DIAGNOSIS — E7889 Other lipoprotein metabolism disorders: Secondary | ICD-10-CM

## 2019-04-14 DIAGNOSIS — Z0000 Encounter for general adult medical examination without abnormal findings: Secondary | ICD-10-CM

## 2019-04-14 DIAGNOSIS — E785 Hyperlipidemia, unspecified: Secondary | ICD-10-CM

## 2019-04-14 DIAGNOSIS — M25842 Other specified joint disorders, left hand: Secondary | ICD-10-CM

## 2019-04-14 DIAGNOSIS — D229 Melanocytic nevi, unspecified: Secondary | ICD-10-CM

## 2019-04-14 DIAGNOSIS — J449 Chronic obstructive pulmonary disease, unspecified: Secondary | ICD-10-CM

## 2019-04-14 MED ORDER — BLOOD GLUCOSE MONITOR KIT: PACK | 0 refills | Status: AC

## 2019-04-14 MED ORDER — SIMVASTATIN 20 MG PO TABS: 20.0000 mg | ORAL_TABLET | Freq: Every evening | ORAL | 1 refills | Status: AC

## 2019-04-14 NOTE — Patient Instructions (Signed)
Carbohydrate Counting for Diabetes Mellitus, Adult  Carbohydrate counting is a method of keeping track of how many carbohydrates you eat. Eating carbohydrates naturally increases the amount of sugar (glucose) in the blood. Counting how many carbohydrates you eat helps keep your blood glucose within normal limits, which helps you manage your diabetes (diabetes mellitus). It is important to know how many carbohydrates you can safely have in each meal. This is different for every person. A diet and nutrition specialist (registered dietitian) can help you make a meal plan and calculate how many carbohydrates you should have at each meal and snack. Carbohydrates are found in the following foods:  Grains, such as breads and cereals.  Dried beans and soy products.  Starchy vegetables, such as potatoes, peas, and corn.  Fruit and fruit juices.  Milk and yogurt.  Sweets and snack foods, such as cake, cookies, candy, chips, and soft drinks. How do I count carbohydrates? There are two ways to count carbohydrates in food. You can use either of the methods or a combination of both. Reading "Nutrition Facts" on packaged food The "Nutrition Facts" list is included on the labels of almost all packaged foods and beverages in the [place] It includes:  The serving size.  Information about nutrients in each serving, including the grams (g) of carbohydrate per serving. To use the "Nutrition Facts":  Decide how many servings you will have.  Multiply the number of servings by the number of carbohydrates per serving.  The resulting number is the total amount of carbohydrates that you will be having. Learning standard serving sizes of other foods When you eat carbohydrate foods that are not packaged or do not include "Nutrition Facts" on the label, you need to measure the servings in order to count the amount of carbohydrates:  Measure the foods that you will eat with a food scale or measuring cup, if needed.   Decide how many standard-size servings you will eat.  Multiply the number of servings by 15. Most carbohydrate-rich foods have about 15 g of carbohydrates per serving. ? For example, if you eat 8 oz (170 g) of strawberries, you will have eaten 2 servings and 30 g of carbohydrates (2 servings x 15 g = 30 g).  For foods that have more than one food mixed, such as soups and casseroles, you must count the carbohydrates in each food that is included. The following list contains standard serving sizes of common carbohydrate-rich foods. Each of these servings has about 15 g of carbohydrates:   hamburger bun or  English muffin.   oz (15 mL) syrup.   oz (14 g) jelly.  1 slice of bread.  1 six-inch tortilla.  3 oz (85 g) cooked rice or pasta.  4 oz (113 g) cooked dried beans.  4 oz (113 g) starchy vegetable, such as peas, corn, or potatoes.  4 oz (113 g) hot cereal.  4 oz (113 g) mashed potatoes or  of a large baked potato.  4 oz (113 g) canned or frozen fruit.  4 oz (120 mL) fruit juice.  4-6 crackers.  6 chicken nuggets.  6 oz (170 g) unsweetened dry cereal.  6 oz (170 g) plain fat-free yogurt or yogurt sweetened with artificial sweeteners.  8 oz (240 mL) milk.  8 oz (170 g) fresh fruit or one small piece of fruit.  24 oz (680 g) popped popcorn. Example of carbohydrate counting Sample meal  3 oz (85 g) chicken breast.  6 oz (170 g)   brown rice.  4 oz (113 g) corn.  8 oz (240 mL) milk.  8 oz (170 g) strawberries with sugar-free whipped topping. Carbohydrate calculation 1. Identify the foods that contain carbohydrates: ? Rice. ? Corn. ? Milk. ? Strawberries. 2. Calculate how many servings you have of each food: ? 2 servings rice. ? 1 serving corn. ? 1 serving milk. ? 1 serving strawberries. 3. Multiply each number of servings by 15 g: ? 2 servings rice x 15 g = 30 g. ? 1 serving corn x 15 g = 15 g. ? 1 serving milk x 15 g = 15 g. ? 1 serving  strawberries x 15 g = 15 g. 4. Add together all of the amounts to find the total grams of carbohydrates eaten: ? 30 g + 15 g + 15 g + 15 g = 75 g of carbohydrates total. Summary  Carbohydrate counting is a method of keeping track of how many carbohydrates you eat.  Eating carbohydrates naturally increases the amount of sugar (glucose) in the blood.  Counting how many carbohydrates you eat helps keep your blood glucose within normal limits, which helps you manage your diabetes.  A diet and nutrition specialist (registered dietitian) can help you make a meal plan and calculate how many carbohydrates you should have at each meal and snack. This information is not intended to replace advice given to you by your health care provider. Make sure you discuss any questions you have with your health care provider. Document Released: 07/22/2005 Document Revised: 02/13/2017 Document Reviewed: 01/03/2016 Elsevier Patient Education  2020 Elsevier Inc.  

## 2019-04-14 NOTE — Progress Notes (Signed)
Established Patient Office Visit  Subjective:  Patient ID: Antonio Gonzalez, male    DOB: 08-18-1955  Age: 63 y.o. MRN: [id number]  CC:  Chief Complaint  Patient presents with  . COPD    HPI Antonio Gonzalez presents for follow up of COPD, hyperlipidemia, cyst to left wrist, prediabetes and mole to right temporal area of head. Hereports that his breathing has been stable with current treatment regimen. He does not wake up at night for SOB, and his last exacerbationwas 07/18/18. He reports that he uses Advair, Incruse Ellipta and albuterol as needed.He continues to smoke 1 pack of cigarette weekly and admits the desire to quit. He reportsthat themoletothe righttemporal side of hishead has notchanged in size ,color, notpainfulwhen touched, and it measures 0.5 x 0.4 cm. He states that "it is not pleasing to look at" and he wants it removed.             He reports that he's having some difficulties extending his left index finger that was fractured above the knuckle 3-4 years ago, but he reports dealing with the discomfort. He states that the cyst to his left wrist is causing him some discomfort. Though the cyst has not increased in size, but their is discomfort sometimes when it's touched . He states that he stopped taking Metformin after 2 days because of diarrhea, nausea and dizziness. He states that he will make some life style modification. He denies chest pain, palpitation, dizziness, myalgia,fever, chillsand no further concerns.  Past Medical History:  Diagnosis Date  . COPD (chronic obstructive pulmonary disease) ([place])   . HLD (hyperlipidemia)     Past Surgical History:  Procedure Laterality Date  . NO PAST SURGERIES      Family History  Problem Relation Age of Onset  . Diabetes type II Mother   . Diabetes type II Maternal Grandmother     Social History   Socioeconomic History  . Marital status: Single    Spouse name: Not on file  . Number of children: Not on file  .  Years of education: Not on file  . Highest education level: Not on file  Occupational History  . Not on file  Social Needs  . Financial resource strain: Hard  . Food insecurity    Worry: Often true    Inability: Often true  . Transportation needs    Medical: No    Non-medical: No  Tobacco Use  . Smoking status: Current Every Day Smoker    Packs/day: 0.25    Years: 40.00    Pack years: 10.00    Types: Cigarettes  . Smokeless tobacco: Current User  Substance and Sexual Activity  . Alcohol use: Yes    Comment: seldom  . Drug use: Never  . Sexual activity: Not on file  Lifestyle  . Physical activity    Days per week: 0 days    Minutes per session: 0 min  . Stress: Very much  Relationships  . Social Herbalist on phone: Twice a week    Gets together: Never    Attends religious service: Never    Active member of club or organization: No    Attends meetings of clubs or organizations: Never    Relationship status: Never married  . Intimate partner violence    Fear of current or ex partner: No    Emotionally abused: No    Physically abused: No    Forced sexual activity: No  Other Topics Concern  .  Not on file  Social History Narrative  . Not on file    Outpatient Medications Prior to Visit  Medication Sig Dispense Refill  . albuterol (PROVENTIL HFA;VENTOLIN HFA) 108 (90 Base) MCG/ACT inhaler Inhale 2 puffs into the lungs every 6 (six) hours as needed for wheezing or shortness of breath. 3 Inhaler 2  . Fluticasone-Salmeterol (ADVAIR DISKUS) 250-50 MCG/DOSE AEPB Inhale 1 puff into the lungs 2 (two) times daily. 60 each 3  . umeclidinium bromide (INCRUSE ELLIPTA) 62.5 MCG/INH AEPB Inhale 1 puff into the lungs daily. 1 each 3  . simvastatin (ZOCOR) 20 MG tablet TAKE ONE TABLET BY MOUTH EVERY EVENING 90 tablet 0  . naproxen (NAPROSYN) 500 MG tablet TAKE ONE TABLET BY MOUTH EVERY DAY 30 tablet 0  . metFORMIN (GLUCOPHAGE) 1000 MG tablet Take 0.5 tablets (500 mg total)  by mouth daily with breakfast. (Patient not taking: Reported on 04/14/2019) 30 tablet 0   No facility-administered medications prior to visit.     No Known Allergies  ROS Review of Systems  Constitutional: Negative.   Eyes: Negative.   Respiratory: Negative.   Cardiovascular: Negative.   Gastrointestinal: Negative.   Endocrine: Negative.   Genitourinary: Negative.   Skin: Negative.   Neurological: Negative.   Psychiatric/Behavioral: Negative.       Objective:    Physical Exam  Constitutional: He is oriented to person, place, and time. He appears well-developed and well-nourished.  HENT:  Head: Normocephalic and atraumatic.  Eyes: Pupils are equal, round, and reactive to light. EOM are normal.  Neck: Normal range of motion.  Cardiovascular: Normal rate and regular rhythm.  Pulmonary/Chest: Effort normal and breath sounds normal.  Abdominal: Soft. Bowel sounds are normal.  Musculoskeletal: Normal range of motion.        General: No deformity.  Neurological: He is alert and oriented to person, place, and time. He has normal reflexes.  Skin: Skin is warm and dry.  Psychiatric: He has a normal mood and affect. His behavior is normal. Judgment and thought content normal.    BP 121/79 (BP Location: Right Arm, Patient Position: Sitting)   Pulse 88   Temp (!) 97 F (36.1 C)   Ht 6' (1.829 m)   Wt 246 lb (111.6 kg)   SpO2 95%   BMI 33.36 kg/m  Wt Readings from Last 3 Encounters:  04/14/19 246 lb (111.6 kg)  08/20/18 242 lb 1.6 oz (109.8 kg)  07/16/18 235 lb (106.6 kg)  He was encouraged to continue on weight loss regimen.   Health Maintenance Due  Topic Date Due  . Hepatitis C Screening  September 12, 1955  . OPHTHALMOLOGY EXAM  08/09/1964  . TETANUS/TDAP  08/09/1973  . COLONOSCOPY  08/09/2004  . INFLUENZA VACCINE  03/06/2019    There are no preventive care reminders to display for this patient.  No results found for: TSH Lab Results  Component Value Date   WBC 8.6  01/20/2019   HGB 14.6 01/20/2019   HCT 43.4 01/20/2019   MCV 91 01/20/2019   PLT 276 01/20/2019   Lab Results  Component Value Date   NA 138 03/17/2019   K 4.5 03/17/2019   CO2 24 03/17/2019   GLUCOSE 162 (H) 03/17/2019   BUN 8 03/17/2019   CREATININE 0.85 03/17/2019   BILITOT 0.3 03/17/2019   ALKPHOS 62 03/17/2019   AST 27 03/17/2019   ALT 36 03/17/2019   PROT 6.7 03/17/2019   ALBUMIN 4.6 03/17/2019   CALCIUM 9.3 03/17/2019   ANIONGAP  8 07/18/2018   Lab Results  Component Value Date   CHOL 222 (H) 03/17/2019   Lab Results  Component Value Date   HDL 53 03/17/2019   Lab Results  Component Value Date   LDLCALC 107 (H) 03/17/2019   Lab Results  Component Value Date   TRIG 308 (H) 03/17/2019   Lab Results  Component Value Date   CHOLHDL 4.2 03/17/2019   Lab Results  Component Value Date   HGBA1C 6.1 (H) 03/17/2019      Assessment & Plan:     1. Chronic obstructive pulmonary disease, unspecified COPD type ([place]) - COPD is stable, and he will continue on current treatment regimen.  2. Hyperlipidemia, unspecified hyperlipidemia type - He will continue on current treatment regimen. -Low fat Diet, like low fat dairy products eg skimmed milk -Avoid any fried food -Regular exercise/walk -Goal for Total Cholesterol is less than 200 -Goal for bad cholesterol LDL is less than 100 -Goal for Good cholesterol HDL is more than 45 -Goal for Triglyceride is less than 150 - simvastatin (ZOCOR) 20 MG tablet; Take 1 tablet (20 mg total) by mouth every evening.  Dispense: 90 tablet; Refill: 1  3. Prediabetes - He was advised to check fasting blood glucose, record and bring log to clinic. He was advised to continue on low carb, non concentrated sweet diet. - blood glucose meter kit and supplies KIT; Dispense based on patient and insurance preference. Use up to four times daily as directed. (FOR ICD-9 250.00, 250.01). I box of test strips and lancets.  Dispense: 1 each;  Refill: 0  4. Cyst of joint of hand, left - He was advised to notify clinic for worsening symptoms.  5. Skin mole - He was encouraged to complete charity care application for - Ambulatory referral to Dermatology  6. Health care maintenance -He was encouraged to complete charity care application for - Ambulatory referral to Gastroenterology For Colonoscopy screening.   Follow-up: Return in about 3 months (around 07/14/2019), or if symptoms worsen or fail to improve.    Rhilyn Battle Jerold Coombe, NP

## 2019-04-23 ENCOUNTER — Telehealth: Payer: Self-pay | Admitting: Pharmacist

## 2019-04-23 NOTE — Telephone Encounter (Signed)
04/23/2019 9:41:00 AM - [place] renewal to pt & provider  04/23/2019 Patient enrollment with [place] expired [id number] renewal application-mailing patient his portion to sign & return, also sending scripts to [facility] for Dr. Mable Fill to sign on: Ventolin HFA 43mcg Inhale 2 puffs into the lungs every 6 hours as needed for wheezing or shortness of breath #3, Advair 250/20mcg Inhale 1 puff into the lungs two times a day #3, & Incruse Ellipta 62.26mcg Inhale 1 puff into the lungs every day #3, (replaces Spiriva).Delos Haring

## 2019-04-28 ENCOUNTER — Other Ambulatory Visit: Payer: Self-pay

## 2019-04-28 ENCOUNTER — Telehealth: Payer: Self-pay

## 2019-04-28 DIAGNOSIS — Z1211 Encounter for screening for malignant neoplasm of colon: Secondary | ICD-10-CM

## 2019-04-28 MED ORDER — NA SULFATE-K SULFATE-MG SULF 17.5-3.13-1.6 GM/177ML PO SOLN: 1.0000 | Freq: Once | ORAL | 0 refills | Status: AC

## 2019-04-28 NOTE — Telephone Encounter (Signed)
Gastroenterology Pre-Procedure Review  Request Date: 05/24/19 Requesting Physician: Dr. Bonna Gains  PATIENT REVIEW QUESTIONS: The patient responded to the following health history questions as indicated:    1. Are you having any GI issues? no 2. Do you have a personal history of Polyps? no 3. Do you have a family history of Colon Cancer or Polyps? no 4. Diabetes Mellitus? no 5. Joint replacements in the past 12 months?no 6. Major health problems in the past 3 months?no 7. Any artificial heart valves, MVP, or defibrillator?no    MEDICATIONS & ALLERGIES:    Patient reports the following regarding taking any anticoagulation/antiplatelet therapy:   Plavix, Coumadin, Eliquis, Xarelto, Lovenox, Pradaxa, Brilinta, or Effient? no Aspirin? no  Patient confirms/reports the following medications:  Current Outpatient Medications  Medication Sig Dispense Refill  . albuterol (PROVENTIL HFA;VENTOLIN HFA) 108 (90 Base) MCG/ACT inhaler Inhale 2 puffs into the lungs every 6 (six) hours as needed for wheezing or shortness of breath. 3 Inhaler 2  . blood glucose meter kit and supplies KIT Dispense based on patient and insurance preference. Use up to four times daily as directed. (FOR ICD-9 250.00, 250.01). I box of test strips and lancets. 1 each 0  . Fluticasone-Salmeterol (ADVAIR DISKUS) 250-50 MCG/DOSE AEPB Inhale 1 puff into the lungs 2 (two) times daily. 60 each 3  . naproxen (NAPROSYN) 500 MG tablet TAKE ONE TABLET BY MOUTH EVERY DAY 30 tablet 0  . simvastatin (ZOCOR) 20 MG tablet Take 1 tablet (20 mg total) by mouth every evening. 90 tablet 1  . umeclidinium bromide (INCRUSE ELLIPTA) 62.5 MCG/INH AEPB Inhale 1 puff into the lungs daily. 1 each 3   No current facility-administered medications for this visit.     Patient confirms/reports the following allergies:  No Known Allergies  No orders of the defined types were placed in this encounter.   AUTHORIZATION INFORMATION Primary  Insurance: 1D#: Group #:  Secondary Insurance: 1D#: Group #:  SCHEDULE INFORMATION: Date: 10/19/20Time: Location:[facility]

## 2019-04-28 NOTE — Progress Notes (Signed)
.  COLON

## 2019-05-03 ENCOUNTER — Telehealth: Payer: Self-pay | Admitting: Gastroenterology

## 2019-05-03 MED ORDER — PEG 3350-KCL-NA BICARB-NACL 420 G PO SOLR: 4000.0000 mL | Freq: Once | ORAL | 0 refills | Status: AC

## 2019-05-03 NOTE — Telephone Encounter (Signed)
Ulyess Mort from Medication management left vm they do not carry the prep kit and need to switch to [facility] please [place] [phone]

## 2019-05-03 NOTE — Telephone Encounter (Signed)
Sent medication to the pharmacy  

## 2019-05-07 ENCOUNTER — Telehealth: Payer: Self-pay | Admitting: Pharmacist

## 2019-05-07 NOTE — Telephone Encounter (Signed)
05/07/2019 10:34:47 AM - [place] renewal pending  05/07/2019 [place] renewal pending-for patient to return his portion of [place] application, mailed to patient 04/23/2019---I have received the signed scripts back from [facility] for Advair 250/50, Ventolin HFA 90, & Incruse Ellipta 62.51mcg.Delos Haring

## 2019-05-13 ENCOUNTER — Telehealth: Payer: Self-pay | Admitting: Pharmacist

## 2019-05-13 NOTE — Telephone Encounter (Signed)
05/13/2019 9:30:03 AM - [place] renewal for Incruse,Advair,Ventolin  05/13/2019 Faxed [place] renewal application for Incruse Ellipta 62.30mcg Inhale 1 puff into the lungs every day, #3  (replaces Spiriva), also Ventolin HFA 69mcg Inhale 2 puffs into the lungs every 6 hours as needed for wheezing or shortness of breath, # 3, also Advair 250/50 Inhale 1 puff into the lungs two times a day, #3.Delos Haring

## 2019-05-20 ENCOUNTER — Other Ambulatory Visit: Admission: RE | Admit: 2019-05-20 | Payer: Self-pay | Source: Ambulatory Visit

## 2019-05-20 ENCOUNTER — Telehealth: Payer: Self-pay | Admitting: Gastroenterology

## 2019-05-20 NOTE — Telephone Encounter (Signed)
Pt  Is calling he needs to reschedule his procedure for 05/24/19 his water pump broke on his care please [place] to r/s to maybe the following week

## 2019-05-20 NOTE — Telephone Encounter (Signed)
Returned patients [place] to reschedule his colonoscopy.  He states that his water pump broke, and he's planning surgery on his wrist due to a cyst.  He will [place] back to schedule.  Endoscopy notified of cancellation.  Thanks [employer]

## 2019-05-24 ENCOUNTER — Encounter: Admission: RE | Payer: Self-pay | Source: Home / Self Care

## 2019-05-24 ENCOUNTER — Ambulatory Visit: Admission: RE | Admit: 2019-05-24 | Payer: Self-pay | Source: Home / Self Care | Admitting: Gastroenterology

## 2019-05-24 SURGERY — COLONOSCOPY WITH PROPOFOL
Anesthesia: General

## 2019-05-26 ENCOUNTER — Other Ambulatory Visit: Payer: Self-pay | Admitting: Internal Medicine

## 2019-07-06 ENCOUNTER — Other Ambulatory Visit: Payer: Self-pay | Admitting: Gerontology

## 2019-07-14 ENCOUNTER — Ambulatory Visit: Payer: Self-pay | Admitting: Gerontology

## 2019-07-14 ENCOUNTER — Telehealth: Payer: Self-pay | Admitting: Pharmacist

## 2019-07-14 NOTE — Telephone Encounter (Signed)
07/14/2019 1:49:28 PM - Refills online with GSKVentolin,Advair&Incruse  07/14/2019 Placed refills online with [facility]: Advair 250/50, Incruse Ellipta 62.5 & Ventolin HFA, to ship 08/17/2019, Order# [id number].Delos Haring

## 2019-07-21 ENCOUNTER — Ambulatory Visit: Payer: Self-pay | Admitting: Gerontology

## 2019-07-21 ENCOUNTER — Other Ambulatory Visit: Payer: Self-pay

## 2019-08-08 ENCOUNTER — Telehealth: Payer: Self-pay | Admitting: Pharmacy Technician

## 2019-08-08 NOTE — Telephone Encounter (Signed)
Patient stated that Medicare A, B & D will begin on 08/06/19.  Explained that once prescription drug coverage becomes active, patient will no longer meet [facility]'s eligibility criteria.  Patient verbally acknowledged.  [facility]

## 2019-10-07 ENCOUNTER — Ambulatory Visit: Payer: Self-pay | Admitting: Ophthalmology

## 2020-08-07 IMAGING — CR DG CHEST 2V
1 series · 2 of 2 positions shown · non-contrast
Comparison: 04/27/2018

CLINICAL DATA: Shortness of breath and history of pneumonia

EXAM:
CHEST - 2 VIEW

[Series 1: dg chest 2 view · 0.14mm/px · 2 of 2 slices shown]
[im 1/2]
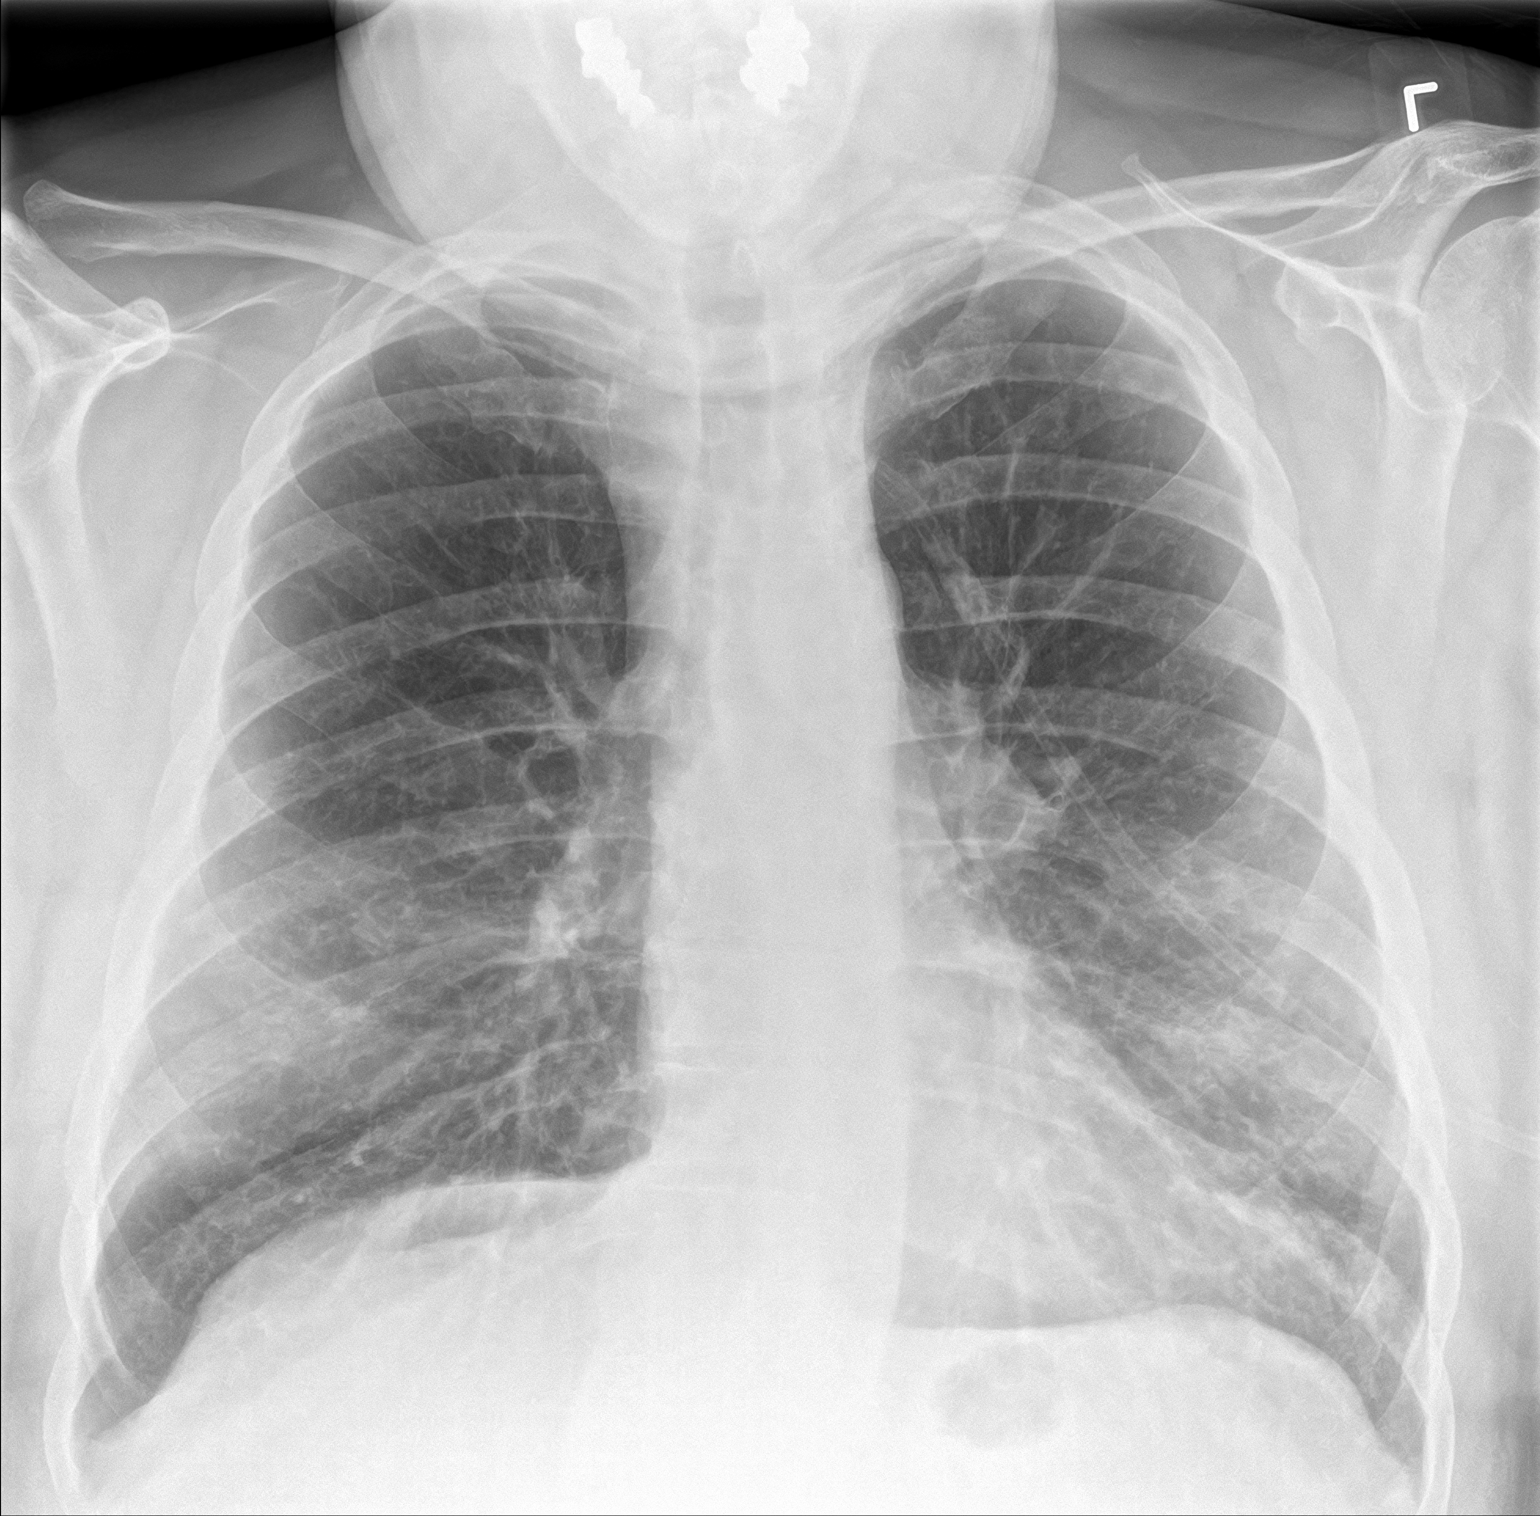
[im 2/2]
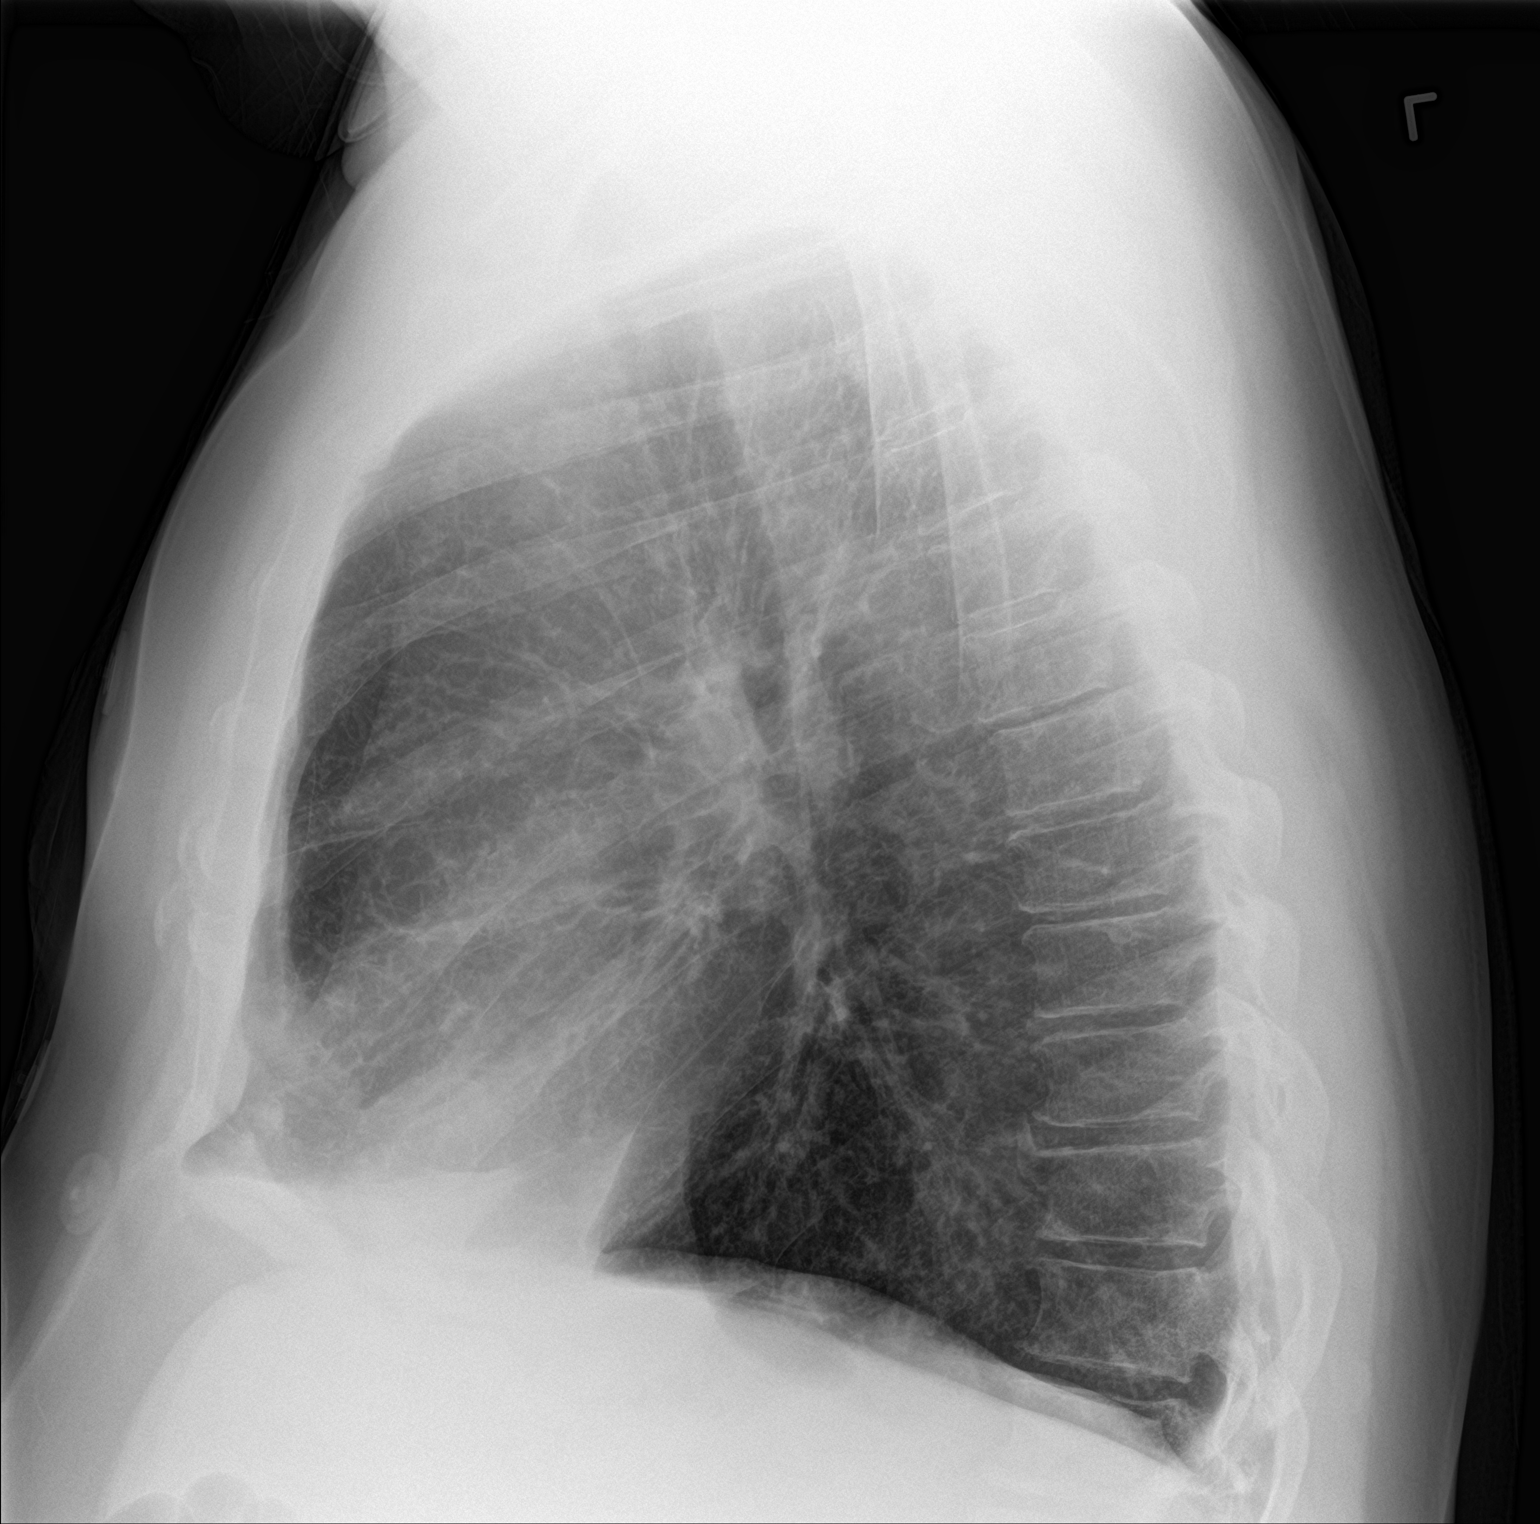

[2 of 2 positions shown; findings below may reference images not displayed]

FINDINGS: The heart size and mediastinal contours are within normal limits.
Both lungs are clear. The visualized skeletal structures are
unremarkable.
IMPRESSION: No active cardiopulmonary disease.

## 2020-08-08 IMAGING — CR DG CHEST 2V
1 series · 2 of 2 positions shown · non-contrast
Comparison: 07/16/2018

CLINICAL DATA: Cough x1 week, suspected pneumonia. Hx of COPD.
Smoker.

EXAM:
CHEST - 2 VIEW

[Series 1: dg chest 2 view · 0.14mm/px · 2 of 2 slices shown]
[im 1/2]
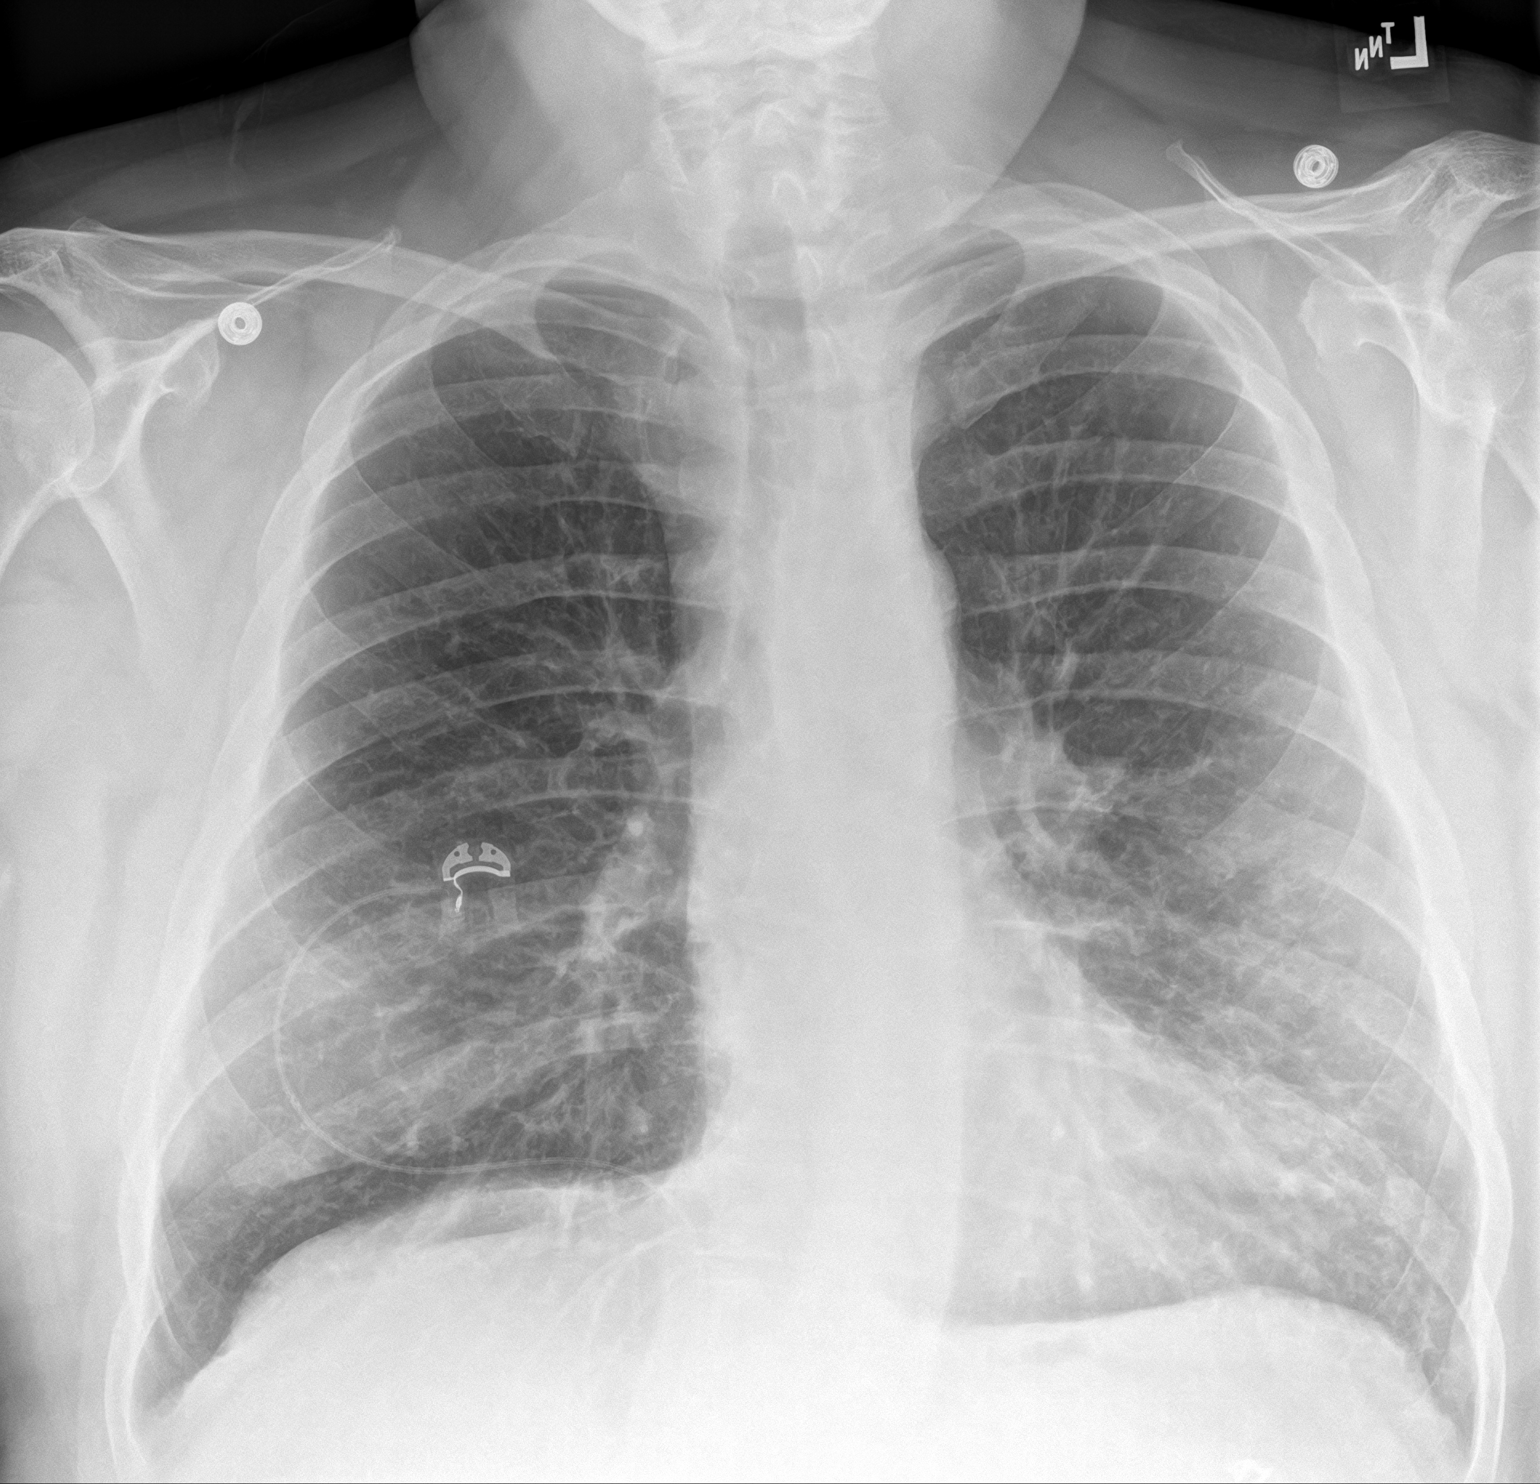
[im 2/2]
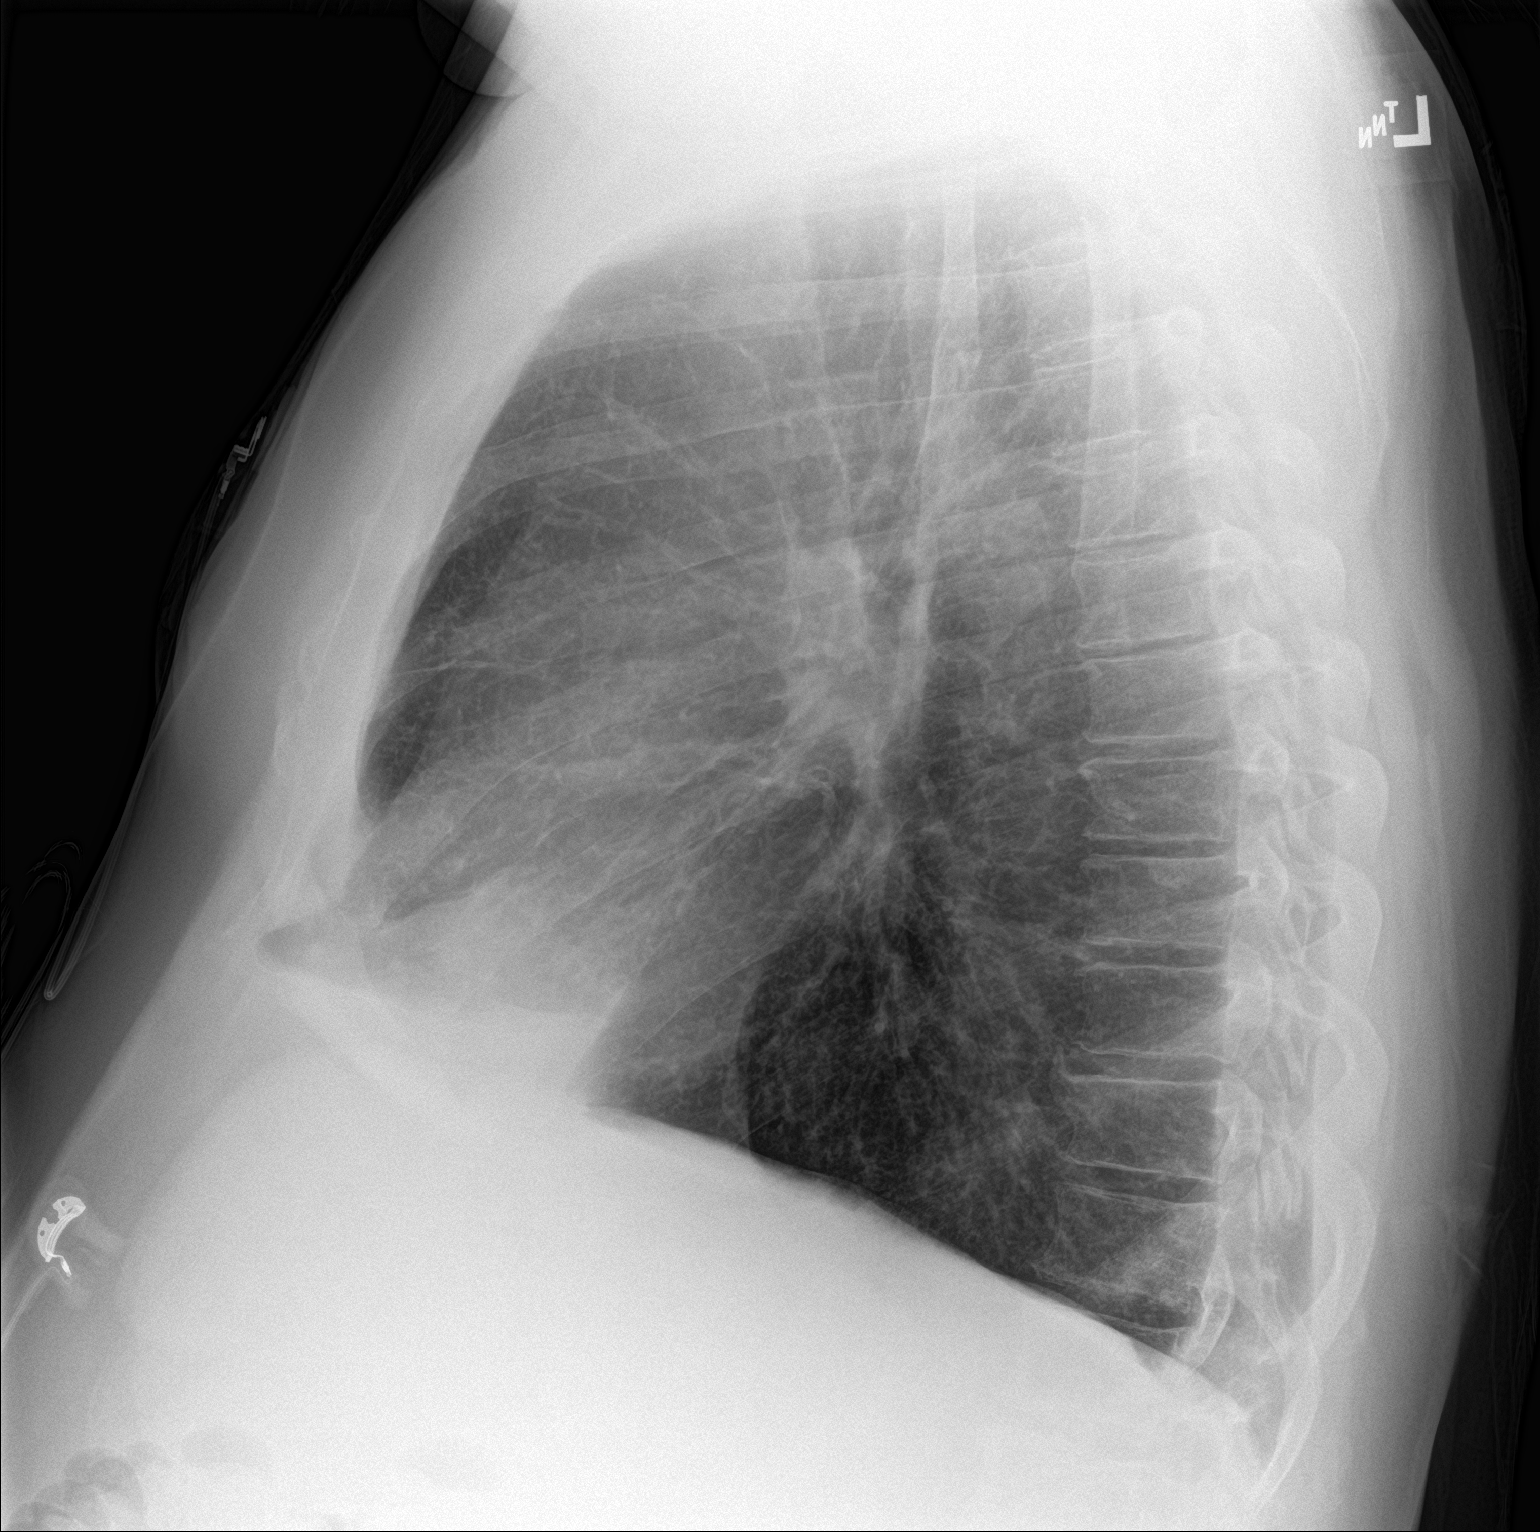

[2 of 2 positions shown; findings below may reference images not displayed]

FINDINGS: Patchy interstitial and airspace opacities in the lingula obscuring
the left heart border. Right lung clear, hyperinflated.

Heart size and mediastinal contours are within normal limits.

No effusion.

Visualized bones unremarkable.
IMPRESSION: Lingular airspace disease suggesting pneumonia

## 2022-11-22 NOTE — Progress Notes (Signed)
Sent message, via epic in basket, requesting orders in epic from surgeon.  

## 2022-11-26 ENCOUNTER — Ambulatory Visit: Payer: Self-pay | Admitting: Student

## 2022-11-26 NOTE — Progress Notes (Signed)
Please send pre op orders for PAT appointment 11/29/22.

## 2022-11-26 NOTE — Progress Notes (Signed)
Please send pre op orders for 24PAT appointment 11/29/22

## 2022-11-26 NOTE — Patient Instructions (Addendum)
SURGICAL WAITING ROOM VISITATION  Patients having surgery or a procedure may have no more than 2 support people in the waiting area - these visitors may rotate.    Children under the age of 41 must have an adult with them who is not the patient.  Due to an increase in RSV and influenza rates and associated hospitalizations, children ages 52 and under may not visit patients in [facility] hospitals.  If the patient needs to stay at the hospital during part of their recovery, the visitor guidelines for inpatient rooms apply. Pre-op nurse will coordinate an appropriate time for 1 support person to accompany patient in pre-op.  This support person may not rotate.    Please refer to the [facility] website for the visitor guidelines for Inpatients (after your surgery is over and you are in a regular room).       Your procedure is scheduled on: 12/12/22   Report to [facility] Main Entrance    Report to admitting at 11:00 AM   [place] this number if you have problems the morning of surgery [phone]   Do not eat food :After Midnight.   After Midnight you may have the following liquids until 10:30 AM DAY OF SURGERY  Water Non-Citrus Juices (without pulp, NO RED-Apple, White grape, White cranberry) Black Coffee (NO MILK/CREAM OR CREAMERS, sugar ok)  Clear Tea (NO MILK/CREAM OR CREAMERS, sugar ok) regular and decaf                             Plain Jell-O (NO RED)                                           Fruit ices (not with fruit pulp, NO RED)                                     Popsicles (NO RED)                                                               Sports drinks like Gatorade (NO RED)                   The day of surgery:  Drink ONE (1) Pre-Surgery G2 at 10:30 AM the morning of surgery. Drink in one sitting. Do not sip.  This drink was given to you during your hospital  pre-op appointment visit. Nothing else to drink after completing the Pre-Surgery G2.           If you have questions, please contact your surgeon's office.   FOLLOW ANY ADDITIONAL PRE OP INSTRUCTIONS YOU RECEIVED FROM YOUR SURGEON'S OFFICE!!!     Oral Hygiene is also important to reduce your risk of infection.                                    Remember - BRUSH YOUR TEETH THE MORNING OF SURGERY WITH YOUR REGULAR TOOTHPASTE  DENTURES WILL BE  REMOVED PRIOR TO SURGERY PLEASE DO NOT APPLY "Poly grip" OR ADHESIVES!!!   Do NOT smoke after Midnight   Take these medicines the morning of surgery with A SIP OF WATER:   Bupropion  Hydrocodone  Omeprazole  Okay to use inhalers                              You may not have any metal on your body including  jewelry, and body piercing             Do not wear  lotions, powders, cologne, or deodorant              Men may shave face and neck.   Do not bring valuables to the hospital. [facility] IS NOT RESPONSIBLE   FOR VALUABLES.   Contacts, glasses, dentures or bridgework may not be worn into surgery.   Bring small overnight bag day of surgery.   DO NOT BRING YOUR HOME MEDICATIONS TO THE HOSPITAL. PHARMACY WILL DISPENSE MEDICATIONS LISTED ON YOUR MEDICATION LIST TO YOU DURING YOUR ADMISSION IN THE HOSPITAL!               Please read over the following fact sheets you were given: IF YOU HAVE QUESTIONS ABOUT YOUR PRE-OP INSTRUCTIONS PLEASE [place] [phone] Gwen   If you received a COVID test during your pre-op visit  it is requested that you wear a mask when out in public, stay away from anyone that may not be feeling well and notify your surgeon if you develop symptoms. If you test positive for Covid or have been in contact with anyone that has tested positive in the last 10 days please notify you surgeon.    Incentive Spirometer  An incentive spirometer is a tool that can help keep your lungs clear and active. This tool measures how well you are filling your lungs with each breath. Taking long deep breaths may help reverse or  decrease the chance of developing breathing (pulmonary) problems (especially infection) following: A long period of time when you are unable to move or be active. BEFORE THE PROCEDURE  If the spirometer includes an indicator to show your best effort, your nurse or respiratory therapist will set it to a desired goal. If possible, sit up straight or lean slightly forward. Try not to slouch. Hold the incentive spirometer in an upright position. INSTRUCTIONS FOR USE  Sit on the edge of your bed if possible, or sit up as far as you can in bed or on a chair. Hold the incentive spirometer in an upright position. Breathe out normally. Place the mouthpiece in your mouth and seal your lips tightly around it. Breathe in slowly and as deeply as possible, raising the piston or the ball toward the top of the column. Hold your breath for 3-5 seconds or for as long as possible. Allow the piston or ball to fall to the bottom of the column. Remove the mouthpiece from your mouth and breathe out normally. Rest for a few seconds and repeat Steps 1 through 7 at least 10 times every 1-2 hours when you are awake. Take your time and take a few normal breaths between deep breaths. The spirometer may include an indicator to show your best effort. Use the indicator as a goal to work toward during each repetition. After each set of 10 deep breaths, practice coughing to be sure your lungs are clear. If you have  an incision (the cut made at the time of surgery), support your incision when coughing by placing a pillow or rolled up towels firmly against it. Once you are able to get out of bed, walk around indoors and cough well. You may stop using the incentive spirometer when instructed by your caregiver.  RISKS AND COMPLICATIONS Take your time so you do not get dizzy or light-headed. If you are in pain, you may need to take or ask for pain medication before doing incentive spirometry. It is harder to take a deep breath if you  are having pain. AFTER USE Rest and breathe slowly and easily. It can be helpful to keep track of a log of your progress. Your caregiver can provide you with a simple table to help with this. If you are using the spirometer at home, follow these instructions: SEEK MEDICAL CARE IF:  You are having difficultly using the spirometer. You have trouble using the spirometer as often as instructed. Your pain medication is not giving enough relief while using the spirometer. You develop fever of 100.5 F (38.1 C) or higher. SEEK IMMEDIATE MEDICAL CARE IF:  You cough up bloody sputum that had not been present before. You develop fever of 102 F (38.9 C) or greater. You develop worsening pain at or near the incision site. MAKE SURE YOU:  Understand these instructions. Will watch your condition. Will get help right away if you are not doing well or get worse. Document Released: 12/02/2006 Document Revised: 10/14/2011 Document Reviewed: 02/02/2007 Exit[employer] Patient Information 2014 ExitCare, [place].   ________________________________________________________________________   WHAT IS A BLOOD TRANSFUSION? Blood Transfusion Information  A transfusion is the replacement of blood or some of its parts. Blood is made up of multiple cells which provide different functions. Red blood cells carry oxygen and are used for blood loss replacement. White blood cells fight against infection. Platelets control bleeding. Plasma helps clot blood. Other blood products are available for specialized needs, such as hemophilia or other clotting disorders. BEFORE THE TRANSFUSION  Who gives blood for transfusions?  Healthy volunteers who are fully evaluated to make sure their blood is safe. This is blood bank blood. Transfusion therapy is the safest it has ever been in the practice of medicine. Before blood is taken from a donor, a complete history is taken to make sure that person has no history of diseases nor  engages in risky social behavior (examples are intravenous drug use or sexual activity with multiple partners). The donor's travel history is screened to minimize risk of transmitting infections, such as malaria. The donated blood is tested for signs of infectious diseases, such as HIV and hepatitis. The blood is then tested to be sure it is compatible with you in order to minimize the chance of a transfusion reaction. If you or a relative donates blood, this is often done in anticipation of surgery and is not appropriate for emergency situations. It takes many days to process the donated blood. RISKS AND COMPLICATIONS Although transfusion therapy is very safe and saves many lives, the main dangers of transfusion include:  Getting an infectious disease. Developing a transfusion reaction. This is an allergic reaction to something in the blood you were given. Every precaution is taken to prevent this. The decision to have a blood transfusion has been considered carefully by your caregiver before blood is given. Blood is not given unless the benefits outweigh the risks. AFTER THE TRANSFUSION Right after receiving a blood transfusion, you will usually feel much  better and more energetic. This is especially true if your red blood cells have gotten low (anemic). The transfusion raises the level of the red blood cells which carry oxygen, and this usually causes an energy increase. The nurse administering the transfusion will monitor you carefully for complications. HOME CARE INSTRUCTIONS  No special instructions are needed after a transfusion. You may find your energy is better. Speak with your caregiver about any limitations on activity for underlying diseases you may have. SEEK MEDICAL CARE IF:  Your condition is not improving after your transfusion. You develop redness or irritation at the intravenous (IV) site. SEEK IMMEDIATE MEDICAL CARE IF:  Any of the following symptoms occur over the next 12  hours: Shaking chills. You have a temperature by mouth above 102 F (38.9 C), not controlled by medicine. Chest, back, or muscle pain. People around you feel you are not acting correctly or are confused. Shortness of breath or difficulty breathing. Dizziness and fainting. You get a rash or develop hives. You have a decrease in urine output. Your urine turns a dark color or changes to pink, red, or brown. Any of the following symptoms occur over the next 10 days: You have a temperature by mouth above 102 F (38.9 C), not controlled by medicine. Shortness of breath. Weakness after normal activity. The white part of the eye turns yellow (jaundice). You have a decrease in the amount of urine or are urinating less often. Your urine turns a dark color or changes to pink, red, or brown. Document Released: 07/19/2000 Document Revised: 10/14/2011 Document Reviewed: 03/07/2008 [facility] Patient Information 2014 [place], [place].

## 2022-11-27 NOTE — Progress Notes (Addendum)
COVID Vaccine Completed:  Yes  Date of COVID positive in last 90 days: N/A  PCP - [place] [facility] (note on chart) Cardiologist - N/A  Medical clearance on chart dated 10-14-22  Chest x-ray - 12-18-21 [facility] EKG - 11-29-22 Epic Stress Test -  N/A ECHO -  N/A Cardiac Cath -  N/A Pacemaker/ICD device last checked: Spinal Cord Stimulator: N/A  Bowel Prep -  N/A  Sleep Study - N/A CPAP -   Prediabetes, CBG 108 at PAT Fasting Blood Sugar - does not recall numbers Checks Blood Sugar - once a twice a week   Last dose of GLP1 agonist-  N/A GLP1 instructions:  N/A   Last dose of SGLT-2 inhibitors-  N/A SGLT-2 instructions: N/A  Blood Thinner Instructions:   N/A Aspirin Instructions: Last Dose:  Activity level:  Can go up a flight of stairs and perform activities of daily living without stopping and without symptoms of chest pain.  Patient has shortness of breath with exertion due to COPD.  Anesthesia review:  COPD with shortness of breath with exertion  Patient denies shortness of breath, fever, cough and chest pain at PAT appointment  Patient verbalized understanding of instructions that were given to them at the PAT appointment. Patient was also instructed that they will need to review over the PAT instructions again at home before surgery.

## 2022-11-29 ENCOUNTER — Other Ambulatory Visit: Payer: Self-pay

## 2022-11-29 ENCOUNTER — Encounter (HOSPITAL_COMMUNITY): Payer: Self-pay

## 2022-11-29 ENCOUNTER — Encounter (HOSPITAL_COMMUNITY)
Admission: RE | Admit: 2022-11-29 | Discharge: 2022-11-29 | Disposition: A | Discharge status: Home / Self Care | Payer: 59 | Source: Ambulatory Visit | Attending: Orthopedic Surgery | Admitting: Orthopedic Surgery

## 2022-11-29 VITALS — BP 131/80 | HR 81 | Temp 98.6°F | Resp 20 | Ht 71.0 in | Wt 227.4 lb

## 2022-11-29 DIAGNOSIS — R7303 Prediabetes: Secondary | ICD-10-CM | POA: Diagnosis not present

## 2022-11-29 DIAGNOSIS — J984 Other disorders of lung: Secondary | ICD-10-CM | POA: Insufficient documentation

## 2022-11-29 DIAGNOSIS — Z01818 Encounter for other preprocedural examination: Secondary | ICD-10-CM | POA: Insufficient documentation

## 2022-11-29 HISTORY — DX: Pneumonia, unspecified organism: J18.9

## 2022-11-29 HISTORY — DX: Dyspnea, unspecified: R06.00

## 2022-11-29 HISTORY — DX: Prediabetes: R73.03

## 2022-11-29 HISTORY — DX: Unspecified osteoarthritis, unspecified site: M19.90

## 2022-11-29 LAB — CBC
HCT: 44.2 % (ref 39.0–52.0)
Hemoglobin: 14.4 g/dL (ref 13.0–17.0)
MCH: 29.3 pg (ref 26.0–34.0)
MCHC: 32.6 g/dL (ref 30.0–36.0)
MCV: 89.8 fL (ref 80.0–100.0)
Platelets: 312 10*3/uL (ref 150–400)
RBC: 4.92 MIL/uL (ref 4.22–5.81)
RDW: 13.8 % (ref 11.5–15.5)
WBC: 9.3 10*3/uL (ref 4.0–10.5)
nRBC: 0 % (ref 0.0–0.2)

## 2022-11-29 LAB — TYPE AND SCREEN
ABO/RH(D): O NEG
Antibody Screen: NEGATIVE

## 2022-11-29 LAB — GLUCOSE, CAPILLARY: Glucose-Capillary: 108 mg/dL — ABNORMAL HIGH (ref 70–99)

## 2022-11-29 LAB — BASIC METABOLIC PANEL
Anion gap: 13 (ref 5–15)
BUN: 15 mg/dL (ref 8–23)
CO2: 22 mmol/L (ref 22–32)
Calcium: 9 mg/dL (ref 8.9–10.3)
Chloride: 101 mmol/L (ref 98–111)
Creatinine, Ser: 0.92 mg/dL (ref 0.61–1.24)
GFR, Estimated: >60 mL/min (ref >60)
Glucose, Bld: 102 mg/dL — ABNORMAL HIGH (ref 70–99)
Potassium: 4 mmol/L (ref 3.5–5.1)
Sodium: 136 mmol/L (ref 135–145)

## 2022-11-29 LAB — HEMOGLOBIN A1C
Hgb A1c MFr Bld: 5.9 % — ABNORMAL HIGH (ref 4.8–5.6)
Mean Plasma Glucose: 122.63 mg/dL

## 2022-11-29 LAB — SURGICAL PCR SCREEN
MRSA, PCR: NEGATIVE
Staphylococcus aureus: NEGATIVE

## 2022-12-09 ENCOUNTER — Ambulatory Visit: Payer: Self-pay | Admitting: Student

## 2022-12-09 NOTE — H&P (Signed)
TOTAL HIP ADMISSION H&P  Patient is admitted for right total hip arthroplasty.  Subjective:  Chief Complaint: right hip pain  HPI: Antonio Gonzalez, 67 y.o. male, has a history of pain and functional disability in the right hip(s) due to  avascular necrosis  and patient has failed non-surgical conservative treatments for greater than 12 weeks to include NSAID's and/or analgesics, corticosteriod injections, use of assistive devices, and activity modification.  Onset of symptoms was gradual starting 1 years ago with rapidlly worsening course since that time.The patient noted no past surgery on the right hip(s).  Patient currently rates pain in the right hip at 10 out of 10 with activity. Patient has night pain, worsening of pain with activity and weight bearing, trendelenberg gait, pain that interfers with activities of daily living, and pain with passive range of motion. Patient has evidence of subchondral cysts, subchondral sclerosis, periarticular osteophytes, joint space narrowing, and avascular necrosis of his right hip  by imaging studies. This condition presents safety issues increasing the risk of falls. There is no current active infection.  Patient Active Problem List   Diagnosis Date Noted   Cyst of joint of hand, left 03/22/2019   Prediabetes 03/22/2019   Skin mole 01/21/2019   Pain in left wrist 01/21/2019   Headache 01/21/2019   COPD with acute exacerbation (HCC) 07/16/2018   Hypoglycemia 07/16/2018   HCAP (healthcare-associated pneumonia) 07/16/2018   COPD (chronic obstructive pulmonary disease) (HCC) 03/27/2016   Low back pain 10/17/2015   Hyperlipidemia 04/06/2015   Dislocation of finger PIP joint 04/06/2015   Past Medical History:  Diagnosis Date   Arthritis    COPD (chronic obstructive pulmonary disease) (HCC)    Dyspnea    HLD (hyperlipidemia)    Pneumonia    2020   Pre-diabetes     Past Surgical History:  Procedure Laterality Date   NO PAST SURGERIES     WISDOM  TOOTH EXTRACTION      Current Outpatient Medications  Medication Sig Dispense Refill Last Dose   albuterol (PROVENTIL HFA;VENTOLIN HFA) 108 (90 Base) MCG/ACT inhaler Inhale 2 puffs into the lungs every 6 (six) hours as needed for wheezing or shortness of breath. 3 Inhaler 2    blood glucose meter kit and supplies KIT Dispense based on patient and insurance preference. Use up to four times daily as directed. (FOR ICD-9 250.00, 250.01). I box of test strips and lancets. 1 each 0    buPROPion (WELLBUTRIN SR) 150 MG 12 hr tablet Take 150 mg by mouth 2 (two) times daily.      Fluticasone-Umeclidin-Vilant (TRELEGY ELLIPTA) 200-62.5-25 MCG/ACT AEPB Inhale 1 puff into the lungs in the morning.      HYDROcodone-acetaminophen (NORCO) 7.5-325 MG tablet Take 2 tablets by mouth in the morning and at bedtime.      naloxone (NARCAN) nasal spray 4 mg/0.1 mL Place 1 spray into the nose as needed (opioid reversal).      naproxen (NAPROSYN) 500 MG tablet TAKE ONE TABLET BY MOUTH EVERY DAY (Patient taking differently: Take 500 mg by mouth 2 (two) times daily as needed (back pain/discomfort.).) 30 tablet 0    nicotine polacrilex (NICORETTE) 2 MG gum Take 2-4 mg by mouth as needed for smoking cessation.      omeprazole (PRILOSEC) 20 MG capsule Take 20 mg by mouth daily before breakfast.      simvastatin (ZOCOR) 20 MG tablet Take 1 tablet (20 mg total) by mouth every evening. 90 tablet 1    tiotropium (  SPIRIVA) 18 MCG inhalation capsule Place 18 mcg into inhaler and inhale at bedtime as needed (respiratory issues.).      No current facility-administered medications for this visit.   No Known Allergies  Social History   Tobacco Use   Smoking status: Every Day    Packs/day: 0.25    Years: 40.00    Additional pack years: 0.00    Total pack years: 10.00    Types: Cigarettes   Smokeless tobacco: Current  Substance Use Topics   Alcohol use: Not Currently    Family History  Problem Relation Age of Onset    Diabetes type II Mother    Diabetes type II Maternal Grandmother      Review of Systems  Musculoskeletal:  Positive for arthralgias and gait problem.  All other systems reviewed and are negative.   Objective:  Physical Exam Constitutional:      Appearance: Normal appearance.  HENT:     Head: Normocephalic and atraumatic.     Nose: Nose normal.     Mouth/Throat:     Mouth: Mucous membranes are moist.     Pharynx: Oropharynx is clear.  Eyes:     Conjunctiva/sclera: Conjunctivae normal.  Cardiovascular:     Rate and Rhythm: Normal rate and regular rhythm.     Pulses: Normal pulses.     Heart sounds: Normal heart sounds.  Pulmonary:     Effort: Pulmonary effort is normal.     Breath sounds: Normal breath sounds.  Abdominal:     General: Abdomen is flat.     Palpations: Abdomen is soft.  Genitourinary:    Comments: deferred Musculoskeletal:     Cervical back: Normal range of motion and neck supple.     Comments: Examination of the right hip reveals no skin wounds or lesions. He has trochanteric tenderness to palpation. There is severely restricted range of motion of the right hip. Pain with terminal flexion rotation. Pain in the position of impingement. Positive Stinchfield.  Distally, there is no focal motor or sensory deficit. He has palpable pedal pulses.  He has a leg length discrepancy, right shorter than left.  He ambulates with a severely antalgic gait using a cane.  Skin:    General: Skin is warm and dry.     Capillary Refill: Capillary refill takes less than 2 seconds.  Neurological:     General: No focal deficit present.     Mental Status: He is alert and oriented to person, place, and time.  Psychiatric:        Mood and Affect: Mood normal.        Behavior: Behavior normal.        Thought Content: Thought content normal.        Judgment: Judgment normal.     Vital signs in last 24 hours: @VSRANGES @  Labs:   Estimated body mass index is 31.72 kg/m  as calculated from the following:   Height as of 11/29/22: 5\' 11"  (1.803 m).   Weight as of 11/29/22: 103.1 kg.   Imaging Review Plain radiographs demonstrate severe degenerative joint disease of the right hip(s). The bone quality appears to be adequate for age and reported activity level.      Assessment/Plan:  End stage arthritis, right hip(s)  The patient history, physical examination, clinical judgement of the provider and imaging studies are consistent with end stage degenerative joint disease of the right hip(s) and total hip arthroplasty is deemed medically necessary. The treatment options including medical  management, injection therapy, arthroscopy and arthroplasty were discussed at length. The risks and benefits of total hip arthroplasty were presented and reviewed. The risks due to aseptic loosening, infection, stiffness, dislocation/subluxation,  thromboembolic complications and other imponderables were discussed.  The patient acknowledged the explanation, agreed to proceed with the plan and consent was signed. Patient is being admitted for inpatient treatment for surgery, pain control, PT, OT, prophylactic antibiotics, VTE prophylaxis, progressive ambulation and ADL's and discharge planning.The patient is planning to be discharged home with HEP after an overnight stay.   Therapy Plans: HEP. Disposition: Home with roommates and sister Planned DVT Prophylaxis: aspirin 81mg  BID DME needed: Rolling walker.  PCP: Cleared. Dental: Cleared.  TXA: IV Allergies: NDKA.  Anesthesia Concerns: None.  BMI: 32.6 Last HgbA1c: 6.0 Other: - Staples* - COPD - Chronic pain syndrome. Hydrocodone 15mg  BID at baseline.  - Hydrocodone 7.5mg  q 4 hours, zofran, meloxicam.  - 11/29/22: Hgb 14.4, Cr. 0.92, K+ 4.0.     Patient's anticipated LOS is less than 2 midnights, meeting these requirements: - Younger than 30 - Lives within 1 hour of care - Has a competent adult at home to recover with  post-op recover - NO history of  - Chronic pain requiring opiods  - Diabetes  - Coronary Artery Disease  - Heart failure  - Heart attack  - Stroke  - DVT/VTE  - Cardiac arrhythmia  - Respiratory Failure/COPD  - Renal failure  - Anemia  - Advanced Liver disease

## 2022-12-09 NOTE — H&P (View-Only) (Signed)
TOTAL HIP ADMISSION H&P  Patient is admitted for right total hip arthroplasty.  Subjective:  Chief Complaint: right hip pain  HPI: Antonio Gonzalez, 67 y.o. male, has a history of pain and functional disability in the right hip(s) due to  avascular necrosis  and patient has failed non-surgical conservative treatments for greater than 12 weeks to include NSAID's and/or analgesics, corticosteriod injections, use of assistive devices, and activity modification.  Onset of symptoms was gradual starting 1 years ago with rapidlly worsening course since that time.The patient noted no past surgery on the right hip(s).  Patient currently rates pain in the right hip at 10 out of 10 with activity. Patient has night pain, worsening of pain with activity and weight bearing, trendelenberg gait, pain that interfers with activities of daily living, and pain with passive range of motion. Patient has evidence of subchondral cysts, subchondral sclerosis, periarticular osteophytes, joint space narrowing, and avascular necrosis of his right hip  by imaging studies. This condition presents safety issues increasing the risk of falls. There is no current active infection.  Patient Active Problem List   Diagnosis Date Noted   Cyst of joint of hand, left 03/22/2019   Prediabetes 03/22/2019   Skin mole 01/21/2019   Pain in left wrist 01/21/2019   Headache 01/21/2019   COPD with acute exacerbation (HCC) 07/16/2018   Hypoglycemia 07/16/2018   HCAP (healthcare-associated pneumonia) 07/16/2018   COPD (chronic obstructive pulmonary disease) (HCC) 03/27/2016   Low back pain 10/17/2015   Hyperlipidemia 04/06/2015   Dislocation of finger PIP joint 04/06/2015   Past Medical History:  Diagnosis Date   Arthritis    COPD (chronic obstructive pulmonary disease) (HCC)    Dyspnea    HLD (hyperlipidemia)    Pneumonia    2020   Pre-diabetes     Past Surgical History:  Procedure Laterality Date   NO PAST SURGERIES     WISDOM  TOOTH EXTRACTION      Current Outpatient Medications  Medication Sig Dispense Refill Last Dose   albuterol (PROVENTIL HFA;VENTOLIN HFA) 108 (90 Base) MCG/ACT inhaler Inhale 2 puffs into the lungs every 6 (six) hours as needed for wheezing or shortness of breath. 3 Inhaler 2    blood glucose meter kit and supplies KIT Dispense based on patient and insurance preference. Use up to four times daily as directed. (FOR ICD-9 250.00, 250.01). I box of test strips and lancets. 1 each 0    buPROPion (WELLBUTRIN SR) 150 MG 12 hr tablet Take 150 mg by mouth 2 (two) times daily.      Fluticasone-Umeclidin-Vilant (TRELEGY ELLIPTA) 200-62.5-25 MCG/ACT AEPB Inhale 1 puff into the lungs in the morning.      HYDROcodone-acetaminophen (NORCO) 7.5-325 MG tablet Take 2 tablets by mouth in the morning and at bedtime.      naloxone (NARCAN) nasal spray 4 mg/0.1 mL Place 1 spray into the nose as needed (opioid reversal).      naproxen (NAPROSYN) 500 MG tablet TAKE ONE TABLET BY MOUTH EVERY DAY (Patient taking differently: Take 500 mg by mouth 2 (two) times daily as needed (back pain/discomfort.).) 30 tablet 0    nicotine polacrilex (NICORETTE) 2 MG gum Take 2-4 mg by mouth as needed for smoking cessation.      omeprazole (PRILOSEC) 20 MG capsule Take 20 mg by mouth daily before breakfast.      simvastatin (ZOCOR) 20 MG tablet Take 1 tablet (20 mg total) by mouth every evening. 90 tablet 1    tiotropium (  SPIRIVA) 18 MCG inhalation capsule Place 18 mcg into inhaler and inhale at bedtime as needed (respiratory issues.).      No current facility-administered medications for this visit.   No Known Allergies  Social History   Tobacco Use   Smoking status: Every Day    Packs/day: 0.25    Years: 40.00    Additional pack years: 0.00    Total pack years: 10.00    Types: Cigarettes   Smokeless tobacco: Current  Substance Use Topics   Alcohol use: Not Currently    Family History  Problem Relation Age of Onset    Diabetes type II Mother    Diabetes type II Maternal Grandmother      Review of Systems  Musculoskeletal:  Positive for arthralgias and gait problem.  All other systems reviewed and are negative.   Objective:  Physical Exam Constitutional:      Appearance: Normal appearance.  HENT:     Head: Normocephalic and atraumatic.     Nose: Nose normal.     Mouth/Throat:     Mouth: Mucous membranes are moist.     Pharynx: Oropharynx is clear.  Eyes:     Conjunctiva/sclera: Conjunctivae normal.  Cardiovascular:     Rate and Rhythm: Normal rate and regular rhythm.     Pulses: Normal pulses.     Heart sounds: Normal heart sounds.  Pulmonary:     Effort: Pulmonary effort is normal.     Breath sounds: Normal breath sounds.  Abdominal:     General: Abdomen is flat.     Palpations: Abdomen is soft.  Genitourinary:    Comments: deferred Musculoskeletal:     Cervical back: Normal range of motion and neck supple.     Comments: Examination of the right hip reveals no skin wounds or lesions. He has trochanteric tenderness to palpation. There is severely restricted range of motion of the right hip. Pain with terminal flexion rotation. Pain in the position of impingement. Positive Stinchfield.  Distally, there is no focal motor or sensory deficit. He has palpable pedal pulses.  He has a leg length discrepancy, right shorter than left.  He ambulates with a severely antalgic gait using a cane.  Skin:    General: Skin is warm and dry.     Capillary Refill: Capillary refill takes less than 2 seconds.  Neurological:     General: No focal deficit present.     Mental Status: He is alert and oriented to person, place, and time.  Psychiatric:        Mood and Affect: Mood normal.        Behavior: Behavior normal.        Thought Content: Thought content normal.        Judgment: Judgment normal.     Vital signs in last 24 hours: @VSRANGES@  Labs:   Estimated body mass index is 31.72 kg/m  as calculated from the following:   Height as of 11/29/22: 5' 11" (1.803 m).   Weight as of 11/29/22: 103.1 kg.   Imaging Review Plain radiographs demonstrate severe degenerative joint disease of the right hip(s). The bone quality appears to be adequate for age and reported activity level.      Assessment/Plan:  End stage arthritis, right hip(s)  The patient history, physical examination, clinical judgement of the provider and imaging studies are consistent with end stage degenerative joint disease of the right hip(s) and total hip arthroplasty is deemed medically necessary. The treatment options including medical   management, injection therapy, arthroscopy and arthroplasty were discussed at length. The risks and benefits of total hip arthroplasty were presented and reviewed. The risks due to aseptic loosening, infection, stiffness, dislocation/subluxation,  thromboembolic complications and other imponderables were discussed.  The patient acknowledged the explanation, agreed to proceed with the plan and consent was signed. Patient is being admitted for inpatient treatment for surgery, pain control, PT, OT, prophylactic antibiotics, VTE prophylaxis, progressive ambulation and ADL's and discharge planning.The patient is planning to be discharged home with HEP after an overnight stay.   Therapy Plans: HEP. Disposition: Home with roommates and sister Planned DVT Prophylaxis: aspirin 81mg BID DME needed: Rolling walker.  PCP: Cleared. Dental: Cleared.  TXA: IV Allergies: NDKA.  Anesthesia Concerns: None.  BMI: 32.6 Last HgbA1c: 6.0 Other: - Staples* - COPD - Chronic pain syndrome. Hydrocodone 15mg BID at baseline.  - Hydrocodone 7.5mg q 4 hours, zofran, meloxicam.  - 11/29/22: Hgb 14.4, Cr. 0.92, K+ 4.0.     Patient's anticipated LOS is less than 2 midnights, meeting these requirements: - Younger than 65 - Lives within 1 hour of care - Has a competent adult at home to recover with  post-op recover - NO history of  - Chronic pain requiring opiods  - Diabetes  - Coronary Artery Disease  - Heart failure  - Heart attack  - Stroke  - DVT/VTE  - Cardiac arrhythmia  - Respiratory Failure/COPD  - Renal failure  - Anemia  - Advanced Liver disease   

## 2022-12-12 ENCOUNTER — Ambulatory Visit (HOSPITAL_COMMUNITY)
Admission: RE | Admit: 2022-12-12 | Discharge: 2022-12-13 | Disposition: A | Discharge status: Home / Self Care | Payer: 59 | Attending: Orthopedic Surgery | Admitting: Orthopedic Surgery

## 2022-12-12 ENCOUNTER — Ambulatory Visit (HOSPITAL_COMMUNITY): Payer: 59

## 2022-12-12 ENCOUNTER — Other Ambulatory Visit: Payer: Self-pay

## 2022-12-12 ENCOUNTER — Encounter (HOSPITAL_COMMUNITY): Payer: Self-pay | Admitting: Orthopedic Surgery

## 2022-12-12 ENCOUNTER — Ambulatory Visit (HOSPITAL_BASED_OUTPATIENT_CLINIC_OR_DEPARTMENT_OTHER): Payer: 59 | Admitting: Anesthesiology

## 2022-12-12 ENCOUNTER — Encounter (HOSPITAL_COMMUNITY)
Admission: RE | Disposition: A | Discharge status: Home / Self Care | Payer: Self-pay | Source: Home / Self Care | Attending: Orthopedic Surgery

## 2022-12-12 ENCOUNTER — Ambulatory Visit (HOSPITAL_COMMUNITY): Payer: 59 | Admitting: Physician Assistant

## 2022-12-12 DIAGNOSIS — Z791 Long term (current) use of non-steroidal anti-inflammatories (NSAID): Secondary | ICD-10-CM | POA: Insufficient documentation

## 2022-12-12 DIAGNOSIS — F17210 Nicotine dependence, cigarettes, uncomplicated: Secondary | ICD-10-CM | POA: Diagnosis not present

## 2022-12-12 DIAGNOSIS — M1611 Unilateral primary osteoarthritis, right hip: Secondary | ICD-10-CM | POA: Diagnosis not present

## 2022-12-12 DIAGNOSIS — E785 Hyperlipidemia, unspecified: Secondary | ICD-10-CM | POA: Diagnosis not present

## 2022-12-12 DIAGNOSIS — Z79899 Other long term (current) drug therapy: Secondary | ICD-10-CM | POA: Diagnosis not present

## 2022-12-12 DIAGNOSIS — E669 Obesity, unspecified: Secondary | ICD-10-CM

## 2022-12-12 DIAGNOSIS — R7303 Prediabetes: Secondary | ICD-10-CM

## 2022-12-12 DIAGNOSIS — M87051 Idiopathic aseptic necrosis of right femur: Secondary | ICD-10-CM | POA: Diagnosis present

## 2022-12-12 DIAGNOSIS — G8929 Other chronic pain: Secondary | ICD-10-CM | POA: Insufficient documentation

## 2022-12-12 DIAGNOSIS — M879 Osteonecrosis, unspecified: Secondary | ICD-10-CM | POA: Insufficient documentation

## 2022-12-12 DIAGNOSIS — Z7951 Long term (current) use of inhaled steroids: Secondary | ICD-10-CM | POA: Diagnosis not present

## 2022-12-12 DIAGNOSIS — J449 Chronic obstructive pulmonary disease, unspecified: Secondary | ICD-10-CM | POA: Insufficient documentation

## 2022-12-12 DIAGNOSIS — Z6831 Body mass index (BMI) 31.0-31.9, adult: Secondary | ICD-10-CM

## 2022-12-12 DIAGNOSIS — M549 Dorsalgia, unspecified: Secondary | ICD-10-CM | POA: Diagnosis not present

## 2022-12-12 HISTORY — PX: TOTAL HIP ARTHROPLASTY: SHX124

## 2022-12-12 LAB — ABO/RH: ABO/RH(D): O NEG

## 2022-12-12 SURGERY — ARTHROPLASTY, HIP, TOTAL, ANTERIOR APPROACH
Anesthesia: Spinal | Site: Hip | Laterality: Right

## 2022-12-12 MED ORDER — PROPOFOL 500 MG/50ML IV EMUL
INTRAVENOUS | Status: DC | PRN
  Administered 2022-12-12: 100 ug/kg/min via INTRAVENOUS

## 2022-12-12 MED ORDER — METOCLOPRAMIDE HCL 5 MG/ML IJ SOLN: 5.0000 mg | Freq: Three times a day (TID) | INTRAMUSCULAR | Status: DC | PRN

## 2022-12-12 MED ORDER — ONDANSETRON HCL 4 MG/2ML IJ SOLN: 4.0000 mg | Freq: Once | INTRAMUSCULAR | Status: DC | PRN

## 2022-12-12 MED ORDER — ACETAMINOPHEN 500 MG PO TABS
1000.0000 mg | ORAL_TABLET | Freq: Once | ORAL | Status: DC
  Filled 2022-12-12: qty 2

## 2022-12-12 MED ORDER — HYDROCODONE-ACETAMINOPHEN 7.5-325 MG PO TABS
1.0000 | ORAL_TABLET | ORAL | Status: DC | PRN
  Administered 2022-12-12 – 2022-12-13 (×4): 2 via ORAL
  Filled 2022-12-12 (×4): qty 2

## 2022-12-12 MED ORDER — LIDOCAINE HCL (PF) 2 % IJ SOLN
INTRAMUSCULAR | Status: DC | PRN
  Administered 2022-12-12: 60 mg via INTRADERMAL

## 2022-12-12 MED ORDER — KETOROLAC TROMETHAMINE 30 MG/ML IJ SOLN
INTRAMUSCULAR | Status: DC | PRN
  Administered 2022-12-12: 30 mg

## 2022-12-12 MED ORDER — METHOCARBAMOL 500 MG IVPB - SIMPLE MED: 500.0000 mg | Freq: Four times a day (QID) | INTRAVENOUS | Status: DC | PRN

## 2022-12-12 MED ORDER — ONDANSETRON HCL 4 MG/2ML IJ SOLN
INTRAMUSCULAR | Status: DC | PRN
  Administered 2022-12-12: 4 mg via INTRAVENOUS

## 2022-12-12 MED ORDER — MIDAZOLAM HCL 5 MG/5ML IJ SOLN
INTRAMUSCULAR | Status: DC | PRN
  Administered 2022-12-12: 2 mg via INTRAVENOUS

## 2022-12-12 MED ORDER — SODIUM CHLORIDE 0.9 % IV SOLN: INTRAVENOUS | Status: DC

## 2022-12-12 MED ORDER — CHLORHEXIDINE GLUCONATE 0.12 % MT SOLN
15.0000 mL | Freq: Once | OROMUCOSAL | Status: AC
  Administered 2022-12-12: 15 mL via OROMUCOSAL

## 2022-12-12 MED ORDER — PANTOPRAZOLE SODIUM 40 MG PO TBEC
40.0000 mg | DELAYED_RELEASE_TABLET | Freq: Every day | ORAL | Status: DC
  Administered 2022-12-12 – 2022-12-13 (×2): 40 mg via ORAL
  Filled 2022-12-12 (×2): qty 1

## 2022-12-12 MED ORDER — MORPHINE SULFATE (PF) 2 MG/ML IV SOLN
0.5000 mg | INTRAVENOUS | Status: DC | PRN
  Administered 2022-12-13: 1 mg via INTRAVENOUS
  Filled 2022-12-12: qty 1

## 2022-12-12 MED ORDER — HYDROCODONE-ACETAMINOPHEN 5-325 MG PO TABS: 1.0000 | ORAL_TABLET | ORAL | Status: DC | PRN

## 2022-12-12 MED ORDER — PHENYLEPHRINE HCL-NACL 20-0.9 MG/250ML-% IV SOLN
INTRAVENOUS | Status: DC | PRN
  Administered 2022-12-12: 50 ug/min via INTRAVENOUS

## 2022-12-12 MED ORDER — LACTATED RINGERS IV SOLN: INTRAVENOUS | Status: DC

## 2022-12-12 MED ORDER — SIMVASTATIN 20 MG PO TABS
20.0000 mg | ORAL_TABLET | Freq: Every evening | ORAL | Status: DC
  Administered 2022-12-12: 20 mg via ORAL
  Filled 2022-12-12: qty 1

## 2022-12-12 MED ORDER — PROPOFOL 10 MG/ML IV BOLUS
INTRAVENOUS | Status: DC | PRN
  Administered 2022-12-12: 70 mg via INTRAVENOUS

## 2022-12-12 MED ORDER — FENTANYL CITRATE PF 50 MCG/ML IJ SOSY: 25.0000 ug | PREFILLED_SYRINGE | INTRAMUSCULAR | Status: DC | PRN

## 2022-12-12 MED ORDER — PHENYLEPHRINE HCL (PRESSORS) 10 MG/ML IV SOLN
INTRAVENOUS | Status: AC
  Filled 2022-12-12: qty 1

## 2022-12-12 MED ORDER — FENTANYL CITRATE (PF) 100 MCG/2ML IJ SOLN
INTRAMUSCULAR | Status: DC | PRN
  Administered 2022-12-12: 50 ug via INTRAVENOUS

## 2022-12-12 MED ORDER — MENTHOL 3 MG MT LOZG: 1.0000 | LOZENGE | OROMUCOSAL | Status: DC | PRN

## 2022-12-12 MED ORDER — DIPHENHYDRAMINE HCL 12.5 MG/5ML PO ELIX
12.5000 mg | ORAL_SOLUTION | ORAL | Status: DC | PRN
  Administered 2022-12-13: 12.5 mg via ORAL
  Filled 2022-12-12: qty 5

## 2022-12-12 MED ORDER — MEPERIDINE HCL 50 MG/ML IJ SOLN: 6.2500 mg | INTRAMUSCULAR | Status: DC | PRN

## 2022-12-12 MED ORDER — ONDANSETRON HCL 4 MG/2ML IJ SOLN
INTRAMUSCULAR | Status: AC
  Filled 2022-12-12: qty 2

## 2022-12-12 MED ORDER — TRANEXAMIC ACID-NACL 1000-0.7 MG/100ML-% IV SOLN
1000.0000 mg | INTRAVENOUS | Status: AC
  Administered 2022-12-12: 1000 mg via INTRAVENOUS
  Filled 2022-12-12: qty 100

## 2022-12-12 MED ORDER — PROPOFOL 10 MG/ML IV BOLUS
INTRAVENOUS | Status: AC
  Filled 2022-12-12: qty 20

## 2022-12-12 MED ORDER — ACETAMINOPHEN 160 MG/5ML PO SOLN: 325.0000 mg | ORAL | Status: DC | PRN

## 2022-12-12 MED ORDER — BUPIVACAINE IN DEXTROSE 0.75-8.25 % IT SOLN
INTRATHECAL | Status: DC | PRN
  Administered 2022-12-12: 1.8 mL via INTRATHECAL

## 2022-12-12 MED ORDER — MIDAZOLAM HCL 2 MG/2ML IJ SOLN
INTRAMUSCULAR | Status: AC
  Filled 2022-12-12: qty 2

## 2022-12-12 MED ORDER — PROPOFOL 1000 MG/100ML IV EMUL
INTRAVENOUS | Status: AC
  Filled 2022-12-12: qty 200

## 2022-12-12 MED ORDER — ALBUTEROL SULFATE (2.5 MG/3ML) 0.083% IN NEBU: 2.5000 mg | INHALATION_SOLUTION | Freq: Four times a day (QID) | RESPIRATORY_TRACT | Status: DC | PRN

## 2022-12-12 MED ORDER — ALBUTEROL SULFATE HFA 108 (90 BASE) MCG/ACT IN AERS: 2.0000 | INHALATION_SPRAY | Freq: Four times a day (QID) | RESPIRATORY_TRACT | Status: DC | PRN

## 2022-12-12 MED ORDER — POVIDONE-IODINE 10 % EX SWAB: 2.0000 | Freq: Once | CUTANEOUS | Status: DC

## 2022-12-12 MED ORDER — ACETAMINOPHEN 325 MG PO TABS: 325.0000 mg | ORAL_TABLET | Freq: Four times a day (QID) | ORAL | Status: DC | PRN

## 2022-12-12 MED ORDER — POLYETHYLENE GLYCOL 3350 17 G PO PACK: 17.0000 g | PACK | Freq: Every day | ORAL | Status: DC | PRN

## 2022-12-12 MED ORDER — ONDANSETRON HCL 4 MG PO TABS: 4.0000 mg | ORAL_TABLET | Freq: Four times a day (QID) | ORAL | Status: DC | PRN

## 2022-12-12 MED ORDER — FLUTICASONE FUROATE-VILANTEROL 200-25 MCG/ACT IN AEPB
1.0000 | INHALATION_SPRAY | Freq: Every day | RESPIRATORY_TRACT | Status: DC
  Filled 2022-12-12: qty 28

## 2022-12-12 MED ORDER — ALUM & MAG HYDROXIDE-SIMETH 200-200-20 MG/5ML PO SUSP: 30.0000 mL | ORAL | Status: DC | PRN

## 2022-12-12 MED ORDER — UMECLIDINIUM BROMIDE 62.5 MCG/ACT IN AEPB: 1.0000 | INHALATION_SPRAY | Freq: Every evening | RESPIRATORY_TRACT | Status: DC | PRN

## 2022-12-12 MED ORDER — KETOROLAC TROMETHAMINE 30 MG/ML IJ SOLN
INTRAMUSCULAR | Status: AC
  Filled 2022-12-12: qty 1

## 2022-12-12 MED ORDER — NICOTINE POLACRILEX 2 MG MT GUM
2.0000 mg | CHEWING_GUM | OROMUCOSAL | Status: DC | PRN
  Filled 2022-12-12: qty 2

## 2022-12-12 MED ORDER — ONDANSETRON HCL 4 MG/2ML IJ SOLN: 4.0000 mg | Freq: Four times a day (QID) | INTRAMUSCULAR | Status: DC | PRN

## 2022-12-12 MED ORDER — EPINEPHRINE PF 1 MG/ML IJ SOLN
INTRAMUSCULAR | Status: AC
  Filled 2022-12-12: qty 1

## 2022-12-12 MED ORDER — CELECOXIB 200 MG PO CAPS
200.0000 mg | ORAL_CAPSULE | Freq: Two times a day (BID) | ORAL | Status: DC
  Administered 2022-12-12 – 2022-12-13 (×2): 200 mg via ORAL
  Filled 2022-12-12 (×2): qty 1

## 2022-12-12 MED ORDER — METOCLOPRAMIDE HCL 5 MG PO TABS: 5.0000 mg | ORAL_TABLET | Freq: Three times a day (TID) | ORAL | Status: DC | PRN

## 2022-12-12 MED ORDER — OXYCODONE HCL 5 MG/5ML PO SOLN: 5.0000 mg | Freq: Once | ORAL | Status: DC | PRN

## 2022-12-12 MED ORDER — TIOTROPIUM BROMIDE MONOHYDRATE 18 MCG IN CAPS: 18.0000 ug | ORAL_CAPSULE | Freq: Every evening | RESPIRATORY_TRACT | Status: DC | PRN

## 2022-12-12 MED ORDER — SENNA 8.6 MG PO TABS
1.0000 | ORAL_TABLET | Freq: Two times a day (BID) | ORAL | Status: DC
  Administered 2022-12-12 – 2022-12-13 (×2): 8.6 mg via ORAL
  Filled 2022-12-12 (×2): qty 1

## 2022-12-12 MED ORDER — BUPIVACAINE HCL 0.25 % IJ SOLN
INTRAMUSCULAR | Status: AC
  Filled 2022-12-12: qty 1

## 2022-12-12 MED ORDER — DEXAMETHASONE SODIUM PHOSPHATE 10 MG/ML IJ SOLN
INTRAMUSCULAR | Status: DC | PRN
  Administered 2022-12-12: 8 mg via INTRAVENOUS

## 2022-12-12 MED ORDER — ACETAMINOPHEN 325 MG PO TABS: 325.0000 mg | ORAL_TABLET | ORAL | Status: DC | PRN

## 2022-12-12 MED ORDER — OXYCODONE HCL 5 MG PO TABS: 5.0000 mg | ORAL_TABLET | Freq: Once | ORAL | Status: DC | PRN

## 2022-12-12 MED ORDER — BUPIVACAINE-EPINEPHRINE 0.25% -1:200000 IJ SOLN
INTRAMUSCULAR | Status: DC | PRN
  Administered 2022-12-12: 30 mL

## 2022-12-12 MED ORDER — CEFAZOLIN SODIUM-DEXTROSE 2-4 GM/100ML-% IV SOLN
2.0000 g | INTRAVENOUS | Status: AC
  Administered 2022-12-12: 2 g via INTRAVENOUS
  Filled 2022-12-12: qty 100

## 2022-12-12 MED ORDER — NALOXONE HCL 4 MG/0.1ML NA LIQD: 1.0000 | NASAL | Status: DC | PRN

## 2022-12-12 MED ORDER — FENTANYL CITRATE (PF) 100 MCG/2ML IJ SOLN
INTRAMUSCULAR | Status: AC
  Filled 2022-12-12: qty 2

## 2022-12-12 MED ORDER — ASPIRIN 81 MG PO CHEW
81.0000 mg | CHEWABLE_TABLET | Freq: Two times a day (BID) | ORAL | Status: DC
  Administered 2022-12-12 – 2022-12-13 (×2): 81 mg via ORAL
  Filled 2022-12-12 (×2): qty 1

## 2022-12-12 MED ORDER — KETOROLAC TROMETHAMINE 15 MG/ML IJ SOLN: 15.0000 mg | Freq: Once | INTRAMUSCULAR | Status: DC

## 2022-12-12 MED ORDER — WATER FOR IRRIGATION, STERILE IR SOLN
Status: DC | PRN
  Administered 2022-12-12: 2000 mL

## 2022-12-12 MED ORDER — SODIUM CHLORIDE 0.9 % IR SOLN
Status: DC | PRN
  Administered 2022-12-12: 3000 mL
  Administered 2022-12-12: 1000 mL

## 2022-12-12 MED ORDER — UMECLIDINIUM BROMIDE 62.5 MCG/ACT IN AEPB
1.0000 | INHALATION_SPRAY | Freq: Every day | RESPIRATORY_TRACT | Status: DC
  Filled 2022-12-12: qty 7

## 2022-12-12 MED ORDER — BUPROPION HCL ER (SR) 150 MG PO TB12
150.0000 mg | ORAL_TABLET | Freq: Two times a day (BID) | ORAL | Status: DC
  Administered 2022-12-12 – 2022-12-13 (×2): 150 mg via ORAL
  Filled 2022-12-12 (×2): qty 1

## 2022-12-12 MED ORDER — CEFAZOLIN SODIUM-DEXTROSE 2-4 GM/100ML-% IV SOLN
2.0000 g | Freq: Four times a day (QID) | INTRAVENOUS | Status: AC
  Administered 2022-12-12 – 2022-12-13 (×2): 2 g via INTRAVENOUS
  Filled 2022-12-12 (×2): qty 100

## 2022-12-12 MED ORDER — ISOPROPYL ALCOHOL 70 % SOLN
Status: DC | PRN
  Administered 2022-12-12: 1 via TOPICAL

## 2022-12-12 MED ORDER — PHENOL 1.4 % MT LIQD: 1.0000 | OROMUCOSAL | Status: DC | PRN

## 2022-12-12 MED ORDER — DEXAMETHASONE SODIUM PHOSPHATE 10 MG/ML IJ SOLN
INTRAMUSCULAR | Status: AC
  Filled 2022-12-12: qty 1

## 2022-12-12 MED ORDER — ORAL CARE MOUTH RINSE: 15.0000 mL | Freq: Once | OROMUCOSAL | Status: AC

## 2022-12-12 MED ORDER — METHOCARBAMOL 500 MG PO TABS: 500.0000 mg | ORAL_TABLET | Freq: Four times a day (QID) | ORAL | Status: DC | PRN

## 2022-12-12 MED ORDER — DOCUSATE SODIUM 100 MG PO CAPS
100.0000 mg | ORAL_CAPSULE | Freq: Two times a day (BID) | ORAL | Status: DC
  Administered 2022-12-12 – 2022-12-13 (×2): 100 mg via ORAL
  Filled 2022-12-12 (×2): qty 1

## 2022-12-12 MED ORDER — SODIUM CHLORIDE (PF) 0.9 % IJ SOLN
INTRAMUSCULAR | Status: AC
  Filled 2022-12-12: qty 30

## 2022-12-12 SURGICAL SUPPLY — 61 items
ADH SKN CLS APL DERMABOND .7 (GAUZE/BANDAGES/DRESSINGS) ×1
APL PRP STRL LF DISP 70% ISPRP (MISCELLANEOUS) ×1
BAG COUNTER SPONGE SURGICOUNT (BAG) IMPLANT
BAG DECANTER FOR FLEXI CONT (MISCELLANEOUS) IMPLANT
BAG SPEC THK2 15X12 ZIP CLS (MISCELLANEOUS)
BAG SPNG CNTER NS LX DISP (BAG)
BAG ZIPLOCK 12X15 (MISCELLANEOUS) IMPLANT
CHLORAPREP W/TINT 26 (MISCELLANEOUS) ×1 IMPLANT
COVER PERINEAL POST (MISCELLANEOUS) ×1 IMPLANT
COVER SURGICAL LIGHT HANDLE (MISCELLANEOUS) ×1 IMPLANT
DERMABOND ADVANCED .7 DNX12 (GAUZE/BANDAGES/DRESSINGS) ×2 IMPLANT
DRAPE IMP U-DRAPE 54X76 (DRAPES) ×1 IMPLANT
DRAPE SHEET LG 3/4 BI-LAMINATE (DRAPES) ×3 IMPLANT
DRAPE STERI IOBAN 125X83 (DRAPES) ×1 IMPLANT
DRAPE U-SHAPE 47X51 STRL (DRAPES) ×2 IMPLANT
DRESSING AQUACEL AG SP 3.5X10 (GAUZE/BANDAGES/DRESSINGS) IMPLANT
DRSG AQUACEL AG ADV 3.5X10 (GAUZE/BANDAGES/DRESSINGS) ×1 IMPLANT
DRSG AQUACEL AG SP 3.5X10 (GAUZE/BANDAGES/DRESSINGS) ×1
ELECT REM PT RETURN 15FT ADLT (MISCELLANEOUS) ×1 IMPLANT
G7 VIT E NTRL LNR 36 SZG (Miscellaneous) IMPLANT
GAUZE SPONGE 4X4 12PLY STRL (GAUZE/BANDAGES/DRESSINGS) ×1 IMPLANT
GLOVE BIO SURGEON STRL SZ7 (GLOVE) ×1 IMPLANT
GLOVE BIO SURGEON STRL SZ8.5 (GLOVE) ×2 IMPLANT
GLOVE BIOGEL PI IND STRL 7.5 (GLOVE) ×1 IMPLANT
GLOVE BIOGEL PI IND STRL 8.5 (GLOVE) ×1 IMPLANT
GOWN SPEC L3 XXLG W/TWL (GOWN DISPOSABLE) ×1 IMPLANT
GOWN STRL REUS W/ TWL XL LVL3 (GOWN DISPOSABLE) ×1 IMPLANT
GOWN STRL REUS W/TWL XL LVL3 (GOWN DISPOSABLE) ×1
HANDPIECE INTERPULSE COAX TIP (DISPOSABLE) ×1
HEAD CERAMIC BIOLOX 36 T1 STD (Head) IMPLANT
HOLDER FOLEY CATH W/STRAP (MISCELLANEOUS) ×1 IMPLANT
HOOD PEEL AWAY T7 (MISCELLANEOUS) ×3 IMPLANT
KIT TURNOVER KIT A (KITS) IMPLANT
MANIFOLD NEPTUNE II (INSTRUMENTS) ×1 IMPLANT
MARKER SKIN DUAL TIP RULER LAB (MISCELLANEOUS) ×1 IMPLANT
NDL SAFETY ECLIP 18X1.5 (MISCELLANEOUS) ×1 IMPLANT
NDL SPNL 18GX3.5 QUINCKE PK (NEEDLE) ×1 IMPLANT
NEEDLE SPNL 18GX3.5 QUINCKE PK (NEEDLE) ×1 IMPLANT
PACK ANTERIOR HIP CUSTOM (KITS) ×1 IMPLANT
PENCIL SMOKE EVACUATOR (MISCELLANEOUS) IMPLANT
SAW OSC TIP CART 19.5X105X1.3 (SAW) ×1 IMPLANT
SEALER BIPOLAR AQUA 6.0 (INSTRUMENTS) ×1 IMPLANT
SET HNDPC FAN SPRY TIP SCT (DISPOSABLE) ×1 IMPLANT
SHELL ACET G7 4H 58 SZG HIP (Shell) IMPLANT
SOLUTION PRONTOSAN WOUND 350ML (IRRIGATION / IRRIGATOR) ×1 IMPLANT
SPIKE FLUID TRANSFER (MISCELLANEOUS) ×1 IMPLANT
STEM FEM SZ18 121X42.6 133D (Stem) IMPLANT
SUT MNCRL AB 3-0 PS2 18 (SUTURE) ×1 IMPLANT
SUT MON AB 2-0 CT1 36 (SUTURE) ×1 IMPLANT
SUT STRATAFIX PDO 1 14 VIOLET (SUTURE) ×1
SUT STRATFX PDO 1 14 VIOLET (SUTURE) ×1
SUT VIC AB 0 CT1 27 (SUTURE) ×1
SUT VIC AB 0 CT1 27XBRD ANTBC (SUTURE) IMPLANT
SUT VIC AB 2-0 CT1 27 (SUTURE)
SUT VIC AB 2-0 CT1 TAPERPNT 27 (SUTURE) IMPLANT
SUTURE STRATFX PDO 1 14 VIOLET (SUTURE) ×1 IMPLANT
SYR 3ML LL SCALE MARK (SYRINGE) ×1 IMPLANT
TIP HIGH FLOW IRRIGATION COAX (MISCELLANEOUS) IMPLANT
TRAY FOLEY MTR SLVR 16FR STAT (SET/KITS/TRAYS/PACK) IMPLANT
TUBE SUCTION HIGH CAP CLEAR NV (SUCTIONS) ×1 IMPLANT
WATER STERILE IRR 1000ML POUR (IV SOLUTION) ×1 IMPLANT

## 2022-12-12 NOTE — Interval H&P Note (Signed)
History and Physical Interval Note:  12/12/2022 12:23 PM  Antonio Gonzalez  has presented today for surgery, with the diagnosis of Right hip avascular necrosis.  The various methods of treatment have been discussed with the patient and family. After consideration of risks, benefits and other options for treatment, the patient has consented to  Procedure(s) with comments: TOTAL HIP ARTHROPLASTY ANTERIOR APPROACH (Right) - 130 as a surgical intervention.  The patient's history has been reviewed, patient examined, no change in status, stable for surgery.  I have reviewed the patient's chart and labs.  Questions were answered to the patient's satisfaction.     Iline Oven Kaily Wragg

## 2022-12-12 NOTE — Anesthesia Postprocedure Evaluation (Signed)
Anesthesia Post Note  Patient: Antonio Gonzalez  Procedure(s) Performed: TOTAL HIP ARTHROPLASTY ANTERIOR APPROACH (Right: Hip)     Patient location during evaluation: PACU Anesthesia Type: Spinal Level of consciousness: awake Pain management: pain level controlled Vital Signs Assessment: post-procedure vital signs reviewed and stable Respiratory status: spontaneous breathing Cardiovascular status: stable Postop Assessment: no headache, no backache, spinal receding, patient able to bend at knees and no apparent nausea or vomiting Anesthetic complications: no  No notable events documented.  Last Vitals:  Vitals:   12/12/22 1745 12/12/22 1800  BP: 130/72 124/68  Pulse: 69 78  Resp: 13 16  Temp:    SpO2: 94% 96%    Last Pain:  Vitals:   12/12/22 1800  TempSrc:   PainSc: 0-No pain                 Caren Macadam

## 2022-12-12 NOTE — Op Note (Signed)
OPERATIVE REPORT  SURGEON: Samson Frederic, MD   ASSISTANT: Clint Bolder, PA-C.  PREOPERATIVE DIAGNOSIS: Right hip avascular necrosis.   POSTOPERATIVE DIAGNOSIS: Right hip avascular necrosis.   PROCEDURE: Right total hip arthroplasty, anterior approach.   IMPLANTS: Biomet Taperloc Complete Microplasty stem, size 18 x 121 mm, high offset. Biomet G7 OsseoTi Cup, size 58 mm. Biomet Vivacit-E liner, size 36 mm, G, neutral. Biomet Biolox ceramic head ball, size 36 + 0 mm.  ANESTHESIA:  MAC and Spinal  ESTIMATED BLOOD LOSS:-100 mL    ANTIBIOTICS: 2 g Ancef.  DRAINS: None.  COMPLICATIONS: None.   CONDITION: PACU - hemodynamically stable.   BRIEF CLINICAL NOTE: Antonio Gonzalez is a 67 y.o. male with a long-standing history of Right hip arthritis. After failing conservative management, the patient was indicated for total hip arthroplasty. The risks, benefits, and alternatives to the procedure were explained, and the patient elected to proceed.  PROCEDURE IN DETAIL: Surgical site was marked by myself in the pre-op holding area. Once inside the operating room, spinal anesthesia was obtained, and a foley catheter was inserted. The patient was then positioned on the Hana table.  All bony prominences were well padded.  The hip was prepped and draped in the normal sterile surgical fashion.  A time-out was called verifying side and site of surgery. The patient received IV antibiotics within 60 minutes of beginning the procedure.   Bikini incision was made, and superficial dissection was performed lateral to the ASIS. The direct anterior approach to the hip was performed through the Hueter interval.  Lateral femoral circumflex vessels were treated with the Auqumantys. The anterior capsule was exposed and an inverted T capsulotomy was made. The femoral neck cut was made to the level of the templated cut.  A corkscrew was placed into the head and the head was removed.  The femoral head was found to  have eburnated bone. The head was passed to the back table and was measured. Pubofemoral ligament was released off of the calcar, taking care to stay on bone. Superior capsule was released from the greater trochanter, taking care to stay lateral to the posterior border of the femoral neck in order to preserve the short external rotators.   Acetabular exposure was achieved, and the pulvinar and labrum were excised. Sequential reaming of the acetabulum was then performed up to a size 57 mm reamer. A 58 mm cup was then opened and impacted into place at approximately 40 degrees of abduction and 20 degrees of anteversion. The final polyethylene liner was impacted into place and acetabular osteophytes were removed.    I then gained femoral exposure taking care to protect the abductors and greater trochanter.  This was performed using standard external rotation, extension, and adduction.  A cookie cutter was used to enter the femoral canal, and then the femoral canal finder was placed.  Sequential broaching was performed up to a size 18.  Calcar planer was used on the femoral neck remnant.  I placed a high offset neck and a trial head ball.  The hip was reduced.  Leg lengths and offset were checked fluoroscopically.  The hip was dislocated and trial components were removed.  The final implants were placed, and the hip was reduced.  Fluoroscopy was used to confirm component position and leg lengths.  At 90 degrees of external rotation and full extension, the hip was stable to an anterior directed force.   The wound was copiously irrigated with Prontosan solution and normal saline using pulse  lavage.  Marcaine solution was injected into the periarticular soft tissue.  The wound was closed in layers using #1 Vicryl and V-Loc for the fascia, 2-0 Vicryl for the subcutaneous fat, 2-0 Monocryl for the deep dermal layer, 3-0 running Monocryl subcuticular stitch, and Dermabond for the skin.  Once the glue was fully dried, an  Aquacell Ag dressing was applied.  The patient was transported to the recovery room in stable condition.  Sponge, needle, and instrument counts were correct at the end of the case x2.  The patient tolerated the procedure well and there were no known complications.  The aquamantis was utilized for this case to help facilitate better hemostasis as patient was felt to be at increased risk of bleeding because of complex case requiring increased OR time and/or exposure.  A oscillating saw tip was utilized for this case to prevent damage to the soft tissue structures such as muscles, ligaments and tendons, and to ensure accurate bone cuts. This patient was at increased risk for above structures due to  minimally invasive approach.  Please note that a surgical assistant was a medical necessity for this procedure to perform it in a safe and expeditious manner. Assistant was necessary to provide appropriate retraction of vital neurovascular structures, to prevent femoral fracture, and to allow for anatomic placement of the prosthesis.

## 2022-12-12 NOTE — Anesthesia Procedure Notes (Signed)
Spinal  Patient location during procedure: OR Start time: 12/12/2022 2:50 PM End time: 12/12/2022 2:54 PM Reason for block: surgical anesthesia Staffing Performed: anesthesiologist  Anesthesiologist: Leilani Able, MD Performed by: Leilani Able, MD Authorized by: Leilani Able, MD   Preanesthetic Checklist Completed: patient identified, IV checked, site marked, risks and benefits discussed, surgical consent, monitors and equipment checked, pre-op evaluation and timeout performed Spinal Block Patient position: sitting Prep: DuraPrep and site prepped and draped Patient monitoring: continuous pulse ox and blood pressure Approach: midline Location: L3-4 Injection technique: single-shot Needle Needle type: Pencan  Needle gauge: 24 G Needle length: 10 cm Needle insertion depth: 5 cm Assessment Sensory level: T6 Events: CSF return

## 2022-12-12 NOTE — Anesthesia Preprocedure Evaluation (Signed)
Anesthesia Evaluation  Patient identified by MRN, date of birth, ID band Patient awake    Reviewed: Allergy & Precautions, NPO status , Patient's Chart, lab work & pertinent test results  Airway Mallampati: I       Dental no notable dental hx.    Pulmonary Current Smoker and Patient abstained from smoking.   Pulmonary exam normal        Cardiovascular negative cardio ROS Normal cardiovascular exam     Neuro/Psych  negative psych ROS   GI/Hepatic Neg liver ROS,GERD  ,,  Endo/Other  negative endocrine ROS    Renal/GU negative Renal ROS  negative genitourinary   Musculoskeletal   Abdominal  (+) + obese  Peds  Hematology negative hematology ROS (+)   Anesthesia Other Findings   Reproductive/Obstetrics                             Anesthesia Physical Anesthesia Plan  ASA: 2  Anesthesia Plan: Spinal   Post-op Pain Management: Minimal or no pain anticipated   Induction:   PONV Risk Score and Plan: 0 and Ondansetron, Midazolam and Treatment may vary due to age or medical condition  Airway Management Planned: Natural Airway and Simple Face Mask  Additional Equipment: None  Intra-op Plan:   Post-operative Plan:   Informed Consent: I have reviewed the patients History and Physical, chart, labs and discussed the procedure including the risks, benefits and alternatives for the proposed anesthesia with the patient or authorized representative who has indicated his/her understanding and acceptance.       Plan Discussed with: CRNA  Anesthesia Plan Comments:        Anesthesia Quick Evaluation

## 2022-12-12 NOTE — Transfer of Care (Signed)
Immediate Anesthesia Transfer of Care Note  Patient: Antonio Gonzalez  Procedure(s) Performed: TOTAL HIP ARTHROPLASTY ANTERIOR APPROACH (Right: Hip)  Patient Location: PACU  Anesthesia Type:Spinal  Level of Consciousness: drowsy  Airway & Oxygen Therapy: Patient Spontanous Breathing and Patient connected to face mask oxygen  Post-op Assessment: Report given to RN and Post -op Vital signs reviewed and stable  Post vital signs: Reviewed and stable  Last Vitals:  Vitals Value Taken Time  BP 114/73 12/12/22 1717  Temp    Pulse 68 12/12/22 1718  Resp 15 12/12/22 1718  SpO2 100 % 12/12/22 1718  Vitals shown include unvalidated device data.  Last Pain:  Vitals:   12/12/22 1112  TempSrc:   PainSc: 7       Patients Stated Pain Goal: 5 (12/12/22 1112)  Complications: No notable events documented.

## 2022-12-13 ENCOUNTER — Other Ambulatory Visit (HOSPITAL_COMMUNITY): Payer: Self-pay

## 2022-12-13 DIAGNOSIS — M879 Osteonecrosis, unspecified: Secondary | ICD-10-CM | POA: Diagnosis not present

## 2022-12-13 LAB — CBC
HCT: 34 % — ABNORMAL LOW (ref 39.0–52.0)
Hemoglobin: 11 g/dL — ABNORMAL LOW (ref 13.0–17.0)
MCH: 29.3 pg (ref 26.0–34.0)
MCHC: 32.4 g/dL (ref 30.0–36.0)
MCV: 90.4 fL (ref 80.0–100.0)
Platelets: 238 10*3/uL (ref 150–400)
RBC: 3.76 MIL/uL — ABNORMAL LOW (ref 4.22–5.81)
RDW: 13.9 % (ref 11.5–15.5)
WBC: 13.7 10*3/uL — ABNORMAL HIGH (ref 4.0–10.5)
nRBC: 0 % (ref 0.0–0.2)

## 2022-12-13 LAB — BASIC METABOLIC PANEL
Anion gap: 7 (ref 5–15)
BUN: 19 mg/dL (ref 8–23)
CO2: 24 mmol/L (ref 22–32)
Calcium: 8.1 mg/dL — ABNORMAL LOW (ref 8.9–10.3)
Chloride: 104 mmol/L (ref 98–111)
Creatinine, Ser: 0.96 mg/dL (ref 0.61–1.24)
GFR, Estimated: >60 mL/min (ref >60)
Glucose, Bld: 185 mg/dL — ABNORMAL HIGH (ref 70–99)
Potassium: 4.6 mmol/L (ref 3.5–5.1)
Sodium: 135 mmol/L (ref 135–145)

## 2022-12-13 MED ORDER — SENNA 8.6 MG PO TABS
2.0000 | ORAL_TABLET | Freq: Every day | ORAL | 0 refills | Status: AC
  Filled 2022-12-13: qty 30, 15d supply, fill #0

## 2022-12-13 MED ORDER — POLYETHYLENE GLYCOL 3350 17 G PO PACK
17.0000 g | PACK | Freq: Every day | ORAL | 0 refills | Status: AC | PRN
  Filled 2022-12-13: qty 14, 14d supply, fill #0

## 2022-12-13 MED ORDER — HYDROCODONE-ACETAMINOPHEN 7.5-325 MG PO TABS
1.0000 | ORAL_TABLET | ORAL | 0 refills | Status: AC | PRN
  Filled 2022-12-13: qty 42, 7d supply, fill #0

## 2022-12-13 MED ORDER — ONDANSETRON HCL 4 MG PO TABS
4.0000 mg | ORAL_TABLET | Freq: Three times a day (TID) | ORAL | 0 refills | Status: AC | PRN
  Filled 2022-12-13: qty 30, 10d supply, fill #0

## 2022-12-13 MED ORDER — MELOXICAM 15 MG PO TABS
15.0000 mg | ORAL_TABLET | Freq: Every day | ORAL | 2 refills | Status: AC
  Filled 2022-12-13: qty 30, 30d supply, fill #0

## 2022-12-13 MED ORDER — ASPIRIN 81 MG PO CHEW
81.0000 mg | CHEWABLE_TABLET | Freq: Two times a day (BID) | ORAL | 0 refills | Status: AC
  Filled 2022-12-13: qty 90, 45d supply, fill #0

## 2022-12-13 MED ORDER — DOCUSATE SODIUM 100 MG PO CAPS
100.0000 mg | ORAL_CAPSULE | Freq: Two times a day (BID) | ORAL | 0 refills | Status: AC
  Filled 2022-12-13: qty 60, 30d supply, fill #0

## 2022-12-13 NOTE — TOC Transition Note (Signed)
Transition of Care [facility]) - CM/SW Discharge Note   Patient Details  Name: Antonio Gonzalez MRN: [id number] Date of Birth: 04/05/1956  Transition of [facility]) CM/SW Contact:  Otelia Santee, LCSW Phone Number: 12/13/2022, 9:22 AM   Clinical Narrative:    Pt to return home with plan for HEP. Confirmed pt to have RW delivered to room by [employer]. TOC signing off.    Final next level of care: Home/Self Care Barriers to Discharge: No Barriers Identified   Patient Goals and CMS Choice CMS Medicare.gov Compare Post Acute Care list provided to:: Patient Choice offered to / list presented to : Patient  Discharge Placement                         Discharge Plan and Services Additional resources added to the After Visit Summary for                  DME Arranged: Walker rolling DME Agency: [employer] Date DME Agency Contacted: 12/13/22 Time DME Agency Contacted: [phone] Representative spoke with at DME Agency: Loraine Leriche            Social Determinants of Health (SDOH) Interventions SDOH Screenings   Food Insecurity: Food Insecurity Present (05/05/2018)  Transportation Needs: No Transportation Needs (05/05/2018)  Financial Resource Strain: High Risk (05/05/2018)  Physical Activity: Inactive (05/05/2018)  Social Connections: Socially Isolated (05/05/2018)  Stress: Stress Concern Present (05/05/2018)  Tobacco Use: High Risk (12/12/2022)     Readmission Risk Interventions     No data to display

## 2022-12-13 NOTE — Progress Notes (Signed)
Physical Therapy Treatment Patient Details Name: Antonio Gonzalez MRN: [id number] DOB: [birth year] Today's Date: 12/13/2022   History of Present Illness 67 yo male presents to therapy s/p R THA, anterior approach on 12/12/2022 due to failure of conservative measures. Pt PMH includes but is not limited to: COPD, COPD, LBP, chronic pain, tobacco abuse and HDL.    PT Comments     Antonio Gonzalez is a 67 y.o. male POD 1 s/p R THA, AA. Patient reports mod I with mobility at baseline. Patient is now limited by functional impairments (see PT problem list below) and is mod I for bed mobility and S for transfers. Patient was able to ambulate 50 feet with RW and min guard level of assist. Patient instructed in exercise to facilitate ROM and circulation to manage edema. Pt participated with step navigation training with min guard cues for 4 steps. Pt indicates R knee pain, back pain and R hip pain and rates at 9/10 with pt premedicated for am Eval and pm tx session.  Patient will benefit from continued skilled PT interventions to address impairments and progress towards PLOF. Acute PT will follow to progress mobility and stair training in preparation for safe discharge home with family and social support with HEP.   Recommendations for follow up therapy are one component of a multi-disciplinary discharge planning process, led by the attending physician.  Recommendations may be updated based on patient status, additional functional criteria and insurance authorization.  Follow Up Recommendations       Assistance Recommended at Discharge Intermittent Supervision/Assistance  Patient can return home with the following A little help with walking and/or transfers;A little help with bathing/dressing/bathroom;Assistance with cooking/housework;Assist for transportation;Help with stairs or ramp for entrance   Equipment Recommendations  Rolling walker (2 wheels)    Recommendations for Other Services       Precautions /  Restrictions Precautions Precautions: Fall Restrictions Weight Bearing Restrictions: No RLE Weight Bearing: Weight bearing as tolerated     Mobility  Bed Mobility Overal bed mobility: Modified Independent Bed Mobility: Supine to Sit, Sit to Supine     Supine to sit: Modified independent (Device/Increase time) Sit to supine: Modified independent (Device/Increase time)   General bed mobility comments: min cues    Transfers Overall transfer level: Needs assistance Equipment used: Rolling walker (2 wheels) Transfers: Sit to/from Stand Sit to Stand: Supervision           General transfer comment: min cues for proper UE and AD placement    Ambulation/Gait Ambulation/Gait assistance: Min guard Gait Distance (Feet): 50 Feet Assistive device: Rolling walker (2 wheels) Gait Pattern/deviations: Step-to pattern, Antalgic Gait velocity: decreased     General Gait Details: cues for proper distance from RW and extension posture   Stairs Stairs: Yes Stairs assistance: Min guard Stair Management: Two rails Number of Stairs: 4 General stair comments: cues for proper sequencing and LE placement   Wheelchair Mobility    Modified Rankin (Stroke Patients Only)       Balance Overall balance assessment: Needs assistance Sitting-balance support: Feet supported Sitting balance-Leahy Scale: Fair     Standing balance support: Bilateral upper extremity supported, Reliant on assistive device for balance, During functional activity (static standing no UE support) Standing balance-Leahy Scale: Fair                              Cognition Arousal/Alertness: Awake/alert Behavior During Therapy: WFL for tasks assessed/performed Overall Cognitive Status:  Within Functional Limits for tasks assessed                                          Exercises Total Joint Exercises Ankle Circles/Pumps: AROM, Both, 20 reps Quad Sets: AROM, Right, 10 reps Heel  Slides: AROM, Right, 10 reps Hip ABduction/ADduction: AROM, Right, 10 reps, Supine, Standing Long Arc Quad: AROM, Right, 10 reps Marching in Standing: AROM, Right, 10 reps Standing Hip Extension: AROM, Right, 10 reps    General Comments        Pertinent Vitals/Pain Pain Assessment Pain Assessment: 0-10 Pain Score: 9  (hurts like hell, pain medication administered ~ 30 mins prior to PT tx session) Pain Location: R knee and R hip back is sore too Pain Descriptors / Indicators: Constant, Cramping, Discomfort, Operative site guarding, Sore Pain Intervention(s): Limited activity within patient's tolerance, Monitored during session, Premedicated before session    Home Living Family/patient expects to be discharged to:: Private residence Living Arrangements: Non-relatives/Friends Available Help at Discharge: Friend(s) Type of Home: House Home Access: Stairs to enter Entrance Stairs-Rails: Lawyer of Steps: 6   Home Layout: One level Home Equipment: Rollator (4 wheels);Cane - single point      Prior Function            PT Goals (current goals can now be found in the care plan section) Acute Rehab PT Goals Patient Stated Goal: go out grocery store, golf, visit friends PT Goal Formulation: With patient Time For Goal Achievement: 12/27/22 Potential to Achieve Goals: Good    Frequency    7X/week      PT Plan      Co-evaluation              AM-PAC PT "6 Clicks" Mobility   Outcome Measure  Help needed turning from your back to your side while in a flat bed without using bedrails?: None Help needed moving from lying on your back to sitting on the side of a flat bed without using bedrails?: None Help needed moving to and from a bed to a chair (including a wheelchair)?: A Little Help needed standing up from a chair using your arms (e.g., wheelchair or bedside chair)?: A Little Help needed to walk in hospital room?: A Little Help needed  climbing 3-5 steps with a railing? : A Little 6 Click Score: 20    End of Session Equipment Utilized During Treatment: Gait belt Activity Tolerance: Patient tolerated treatment well Patient left: in bed;with [place]on bell/phone within reach Nurse Communication: Mobility status;Other (comment) (readiness for d/c) PT Visit Diagnosis: Unsteadiness on feet (R26.81);Other abnormalities of gait and mobility (R26.89);Muscle weakness (generalized) (M62.81);Pain Pain - Right/Left: Right Pain - part of body: Hip;Leg     Time: 1610-9604 PT Time Calculation (min) (ACUTE ONLY): 19 min  Charges:  $Gait Training: 8-22 mins                     Rica Mote, PT    Jacqualyn Posey 12/13/2022, 2:06 PM

## 2022-12-13 NOTE — Progress Notes (Signed)
    Subjective:  Patient reports pain in his hip as soreness and mild. He is reporting that majority of his pain is coming from his back today and he is having trouble getting comfortable. Denies N/V/CP/SOB. He denies any tingling or numbness in LE bilaterally.     Objective:   VITALS:   Vitals:   12/12/22 1823 12/12/22 2047 12/13/22 0024 12/13/22 0426  BP: (!) 144/74 (!) 146/85 (!) 140/74 (!) 128/59  Pulse: 66 77 80 69  Resp: 17 18 20 17   Temp: (!) 97.5 F (36.4 C) 98.1 F (36.7 C) 98.2 F (36.8 C) 97.9 F (36.6 C)  TempSrc: Oral Oral  Oral  SpO2: 100% 95% 97% 95%  Weight:      Height:        Patient is lying in bed. NAD.  Neurologically intact ABD soft Neurovascular intact Sensation intact distally Intact pulses distally Dorsiflexion/Plantar flexion intact Incision: dressing C/D/I No cellulitis present Compartment soft   Lab Results  Component Value Date   WBC 13.7 (H) 12/13/2022   HGB 11.0 (L) 12/13/2022   HCT 34.0 (L) 12/13/2022   MCV 90.4 12/13/2022   PLT 238 12/13/2022   BMET    Component Value Date/Time   NA 135 12/13/2022 0603   NA 138 03/17/2019 1215   K 4.6 12/13/2022 0603   CL 104 12/13/2022 0603   CO2 24 12/13/2022 0603   GLUCOSE 185 (H) 12/13/2022 0603   BUN 19 12/13/2022 0603   BUN 8 03/17/2019 1215   CREATININE 0.96 12/13/2022 0603   CALCIUM 8.1 (L) 12/13/2022 0603   GFRNONAA >60 12/13/2022 0603     Assessment/Plan: 1 Day Post-Op   Principal Problem:   Avascular necrosis of hip, right (HCC)  ABLA. Hemoglobin 11.0. Asymptomatic. Continue to monitor.   WBAT with walker DVT ppx: Aspirin, SCDs, TEDS PO pain control PT/OT: PT has not seen yet. PT to come by today.  Dispo: - D/c home today with HEP pending clearance with PT.    Clois Dupes, PA-C 12/13/2022, 7:58 AM   [facility]  [place] [address], [address], [place], [place] [address] Phone: [phone] [email or web address] Facebook  [employer]

## 2022-12-13 NOTE — Discharge Summary (Signed)
Physician Discharge Summary  Patient ID: Antonio Gonzalez MRN: [id number] DOB/AGE: [birth year] 68 y.o.  Admit date: 12/12/2022 Discharge date: 12/13/2022  Admission Diagnoses:  Avascular necrosis of hip, right [facility])  Discharge Diagnoses:  Principal Problem:   Avascular necrosis of hip, right (HCC)   Past Medical History:  Diagnosis Date   Arthritis    COPD (chronic obstructive pulmonary disease) (HCC)    Dyspnea    HLD (hyperlipidemia)    Pneumonia    2020   Pre-diabetes     Surgeries: Procedure(s): TOTAL HIP ARTHROPLASTY ANTERIOR APPROACH on 12/12/2022   Consultants (if any):   Discharged Condition: Improved  Hospital Course: Antonio Gonzalez is an 67 y.o. male who was admitted 12/12/2022 with a diagnosis of Avascular necrosis of hip, right (HCC) and went to the operating room on 12/12/2022 and underwent the above named procedures.    He was given perioperative antibiotics:  Anti-infectives (From admission, onward)    Start     Dose/Rate Route Frequency Ordered Stop   12/12/22 2100  ceFAZolin (ANCEF) IVPB 2g/100 mL premix        2 g 200 mL/hr over 30 Minutes Intravenous Every 6 hours 12/12/22 1823 12/13/22 0255   12/12/22 1100  ceFAZolin (ANCEF) IVPB 2g/100 mL premix        2 g 200 mL/hr over 30 Minutes Intravenous On Krise to O.R. 12/12/22 1046 12/12/22 1516       He was given sequential compression devices, early ambulation, and aspirin for DVT prophylaxis.  POD#1 Patient reports the pain in his hip is doing well. He does endorse pain in his back that is chronic. He ambulated well with PT. D/c home with HEP.   He benefited maximally from the hospital stay and there were no complications.    Recent vital signs:  Vitals:   12/13/22 0916 12/13/22 1316  BP:  139/68  Pulse:  69  Resp:  18  Temp:  97.8 F (36.6 C)  SpO2: 96% 96%    Recent laboratory studies:  Lab Results  Component Value Date   HGB 11.0 (L) 12/13/2022   HGB 14.4 11/29/2022   HGB 14.6 01/20/2019   Lab  Results  Component Value Date   WBC 13.7 (H) 12/13/2022   PLT 238 12/13/2022   No results found for: "INR" Lab Results  Component Value Date   NA 135 12/13/2022   K 4.6 12/13/2022   CL 104 12/13/2022   CO2 24 12/13/2022   BUN 19 12/13/2022   CREATININE 0.96 12/13/2022   GLUCOSE 185 (H) 12/13/2022     Allergies as of 12/13/2022   No Known Allergies      Medication List     STOP taking these medications    acetaminophen 650 MG CR tablet Commonly known as: TYLENOL   naproxen 500 MG tablet Commonly known as: NAPROSYN       TAKE these medications    albuterol 108 (90 Base) MCG/ACT inhaler Commonly known as: VENTOLIN HFA Inhale 2 puffs into the lungs every 6 (six) hours as needed for wheezing or shortness of breath.   aspirin 81 MG chewable tablet Commonly known as: Aspirin Childrens Chew 1 tablet (81 mg total) by mouth 2 (two) times daily with a meal.   blood glucose meter kit and supplies Kit Dispense based on patient and insurance preference. Use up to four times daily as directed. (FOR ICD-9 250.00, 250.01). I box of test strips and lancets.   buPROPion 150 MG 12 hr tablet Commonly  known as: WELLBUTRIN SR Take 150 mg by mouth 2 (two) times daily.   docusate sodium 100 MG capsule Commonly known as: Colace Take 1 capsule (100 mg total) by mouth 2 (two) times daily.   HYDROcodone-acetaminophen 7.5-325 MG tablet Commonly known as: NORCO Take 1 tablet by mouth every 4 (four) hours as needed for up to 7 days for moderate pain or severe pain. What changed:  how much to take when to take this reasons to take this   meloxicam 15 MG tablet Commonly known as: MOBIC Take 1 tablet (15 mg total) by mouth daily.   naloxone 4 MG/0.1ML Liqd nasal spray kit Commonly known as: NARCAN Place 1 spray into the nose as needed (opioid reversal).   nicotine polacrilex 2 MG gum Commonly known as: NICORETTE Take 2-4 mg by mouth as needed for smoking cessation.    omeprazole 20 MG capsule Commonly known as: PRILOSEC Take 20 mg by mouth daily before breakfast.   ondansetron 4 MG tablet Commonly known as: Zofran Take 1 tablet (4 mg total) by mouth every 8 (eight) hours as needed for nausea or vomiting.   polyethylene glycol 17 g packet Commonly known as: MiraLax Take 17 g by mouth daily as needed for mild constipation or moderate constipation.   senna 8.6 MG Tabs tablet Commonly known as: SENOKOT Take 2 tablets (17.2 mg total) by mouth at bedtime for 15 days.   simvastatin 20 MG tablet Commonly known as: ZOCOR Take 1 tablet (20 mg total) by mouth every evening.   tiotropium 18 MCG inhalation capsule Commonly known as: SPIRIVA Place 18 mcg into inhaler and inhale at bedtime as needed (respiratory issues.).   Trelegy Ellipta 200-62.5-25 MCG/ACT Aepb Generic drug: Fluticasone-Umeclidin-Vilant Inhale 1 puff into the lungs in the morning.               Discharge Care Instructions  (From admission, onward)           Start     Ordered   12/13/22 0000  Weight bearing as tolerated        12/13/22 0807   12/13/22 0000  Change dressing       Comments: Do not change your dressing.   12/13/22 0807              WEIGHT BEARING   Weight bearing as tolerated with assist device (walker, cane, etc) as directed, use it as long as suggested by your surgeon or therapist, typically at least 4-6 weeks.   EXERCISES  Results after joint replacement surgery are often greatly improved when you follow the exercise, range of motion and muscle strengthening exercises prescribed by your doctor. Safety measures are also important to protect the joint from further injury. Any time any of these exercises cause you to have increased pain or swelling, decrease what you are doing until you are comfortable again and then slowly increase them. If you have problems or questions, Carp your caregiver or physical therapist for advice.   Rehabilitation is  important following a joint replacement. After just a few days of immobilization, the muscles of the leg can become weakened and shrink (atrophy).  These exercises are designed to build up the tone and strength of the thigh and leg muscles and to improve motion. Often times heat used for twenty to thirty minutes before working out will loosen up your tissues and help with improving the range of motion but do not use heat for the first two weeks following surgery (sometimes  heat can increase post-operative swelling).   These exercises can be done on a training (exercise) mat, on the floor, on a table or on a bed. Use whatever works the best and is most comfortable for you.    Use music or television while you are exercising so that the exercises are a pleasant break in your day. This will make your life better with the exercises acting as a break in your routine that you can look forward to.   Perform all exercises about fifteen times, three times per day or as directed.  You should exercise both the operative leg and the other leg as well.  Exercises include:   Quad Sets - Tighten up the muscle on the front of the thigh (Quad) and hold for 5-10 seconds.   Straight Leg Raises - With your knee straight (if you were given a brace, keep it on), lift the leg to 60 degrees, hold for 3 seconds, and slowly lower the leg.  Perform this exercise against resistance later as your leg gets stronger.  Leg Slides: Lying on your back, slowly slide your foot toward your buttocks, bending your knee up off the floor (only go as far as is comfortable). Then slowly slide your foot back down until your leg is flat on the floor again.  Angel Wings: Lying on your back spread your legs to the side as far apart as you can without causing discomfort.  Hamstring Strength:  Lying on your back, push your heel against the floor with your leg straight by tightening up the muscles of your buttocks.  Repeat, but this time bend your knee to  a comfortable angle, and push your heel against the floor.  You may put a pillow under the heel to make it more comfortable if necessary.   A rehabilitation program following joint replacement surgery can speed recovery and prevent re-injury in the future due to weakened muscles. Contact your doctor or a physical therapist for more information on knee rehabilitation.    CONSTIPATION  Constipation is defined medically as fewer than three stools per week and severe constipation as less than one stool per week.  Even if you have a regular bowel pattern at home, your normal regimen is likely to be disrupted due to multiple reasons following surgery.  Combination of anesthesia, postoperative narcotics, change in appetite and fluid intake all can affect your bowels.   YOU MUST use at least one of the following options; they are listed in order of increasing strength to get the job done.  They are all available over the counter, and you may need to use some, POSSIBLY even all of these options:    Drink plenty of fluids (prune juice may be helpful) and high fiber foods Colace 100 mg by mouth twice a day  Senokot for constipation as directed and as needed Dulcolax (bisacodyl), take with full glass of water  Miralax (polyethylene glycol) once or twice a day as needed.  If you have tried all these things and are unable to have a bowel movement in the first 3-4 days after surgery Loya either your surgeon or your primary doctor.    If you experience loose stools or diarrhea, hold the medications until you stool forms back up.  If your symptoms do not get better within 1 week or if they get worse, check with your doctor.  If you experience "the worst abdominal pain ever" or develop nausea or vomiting, please contact the office immediately for  further recommendations for treatment.   ITCHING:  If you experience itching with your medications, try taking only a single pain pill, or even half a pain pill at a  time.  You can also use Benadryl over the counter for itching or also to help with sleep.   TED HOSE STOCKINGS:  Use stockings on both legs until for at least 2 weeks or as directed by physician office. They may be removed at night for sleeping.  MEDICATIONS:  See your medication summary on the "After Visit Summary" that nursing will review with you.  You may have some home medications which will be placed on hold until you complete the course of blood thinner medication.  It is important for you to complete the blood thinner medication as prescribed.  PRECAUTIONS:  If you experience chest pain or shortness of breath - [place] 911 immediately for transfer to the hospital emergency department.   If you develop a fever greater that 101 F, purulent drainage from wound, increased redness or drainage from wound, foul odor from the wound/dressing, or calf pain - CONTACT YOUR SURGEON.                                                   FOLLOW-UP APPOINTMENTS:  If you do not already have a post-op appointment, please Glorioso the office for an appointment to be seen by your surgeon.  Guidelines for how soon to be seen are listed in your "After Visit Summary", but are typically between 1-4 weeks after surgery.  OTHER INSTRUCTIONS:   Knee Replacement:  Do not place pillow under knee, focus on keeping the knee straight while resting. CPM instructions: 0-90 degrees, 2 hours in the morning, 2 hours in the afternoon, and 2 hours in the evening. Place foam block, curve side up under heel at all times except when in CPM or when walking.  DO NOT modify, tear, cut, or change the foam block in any way.   MAKE SURE YOU:  Understand these instructions.  Get help right away if you are not doing well or get worse.    Thank you for letting us be a part of your medical care team.  It is a privilege we respect greatly.  We hope these instructions will help you stay on track for a fast and full recovery!   Diagnostic Studies:  DG HIP UNILAT WITH PELVIS 1V RIGHT  Result Date: 12/12/2022 CLINICAL DATA:  RIGHT anterior hip replacement EXAM: DG HIP (WITH OR WITHOUT PELVIS) 1V RIGHT COMPARISON:  None available Fluoroscopy time: 0 minutes 12 seconds Dose: 2.15 mGy Images: 13 FINDINGS: Multiple images were obtained during RIGHT hip arthroplasty. 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 Sequential images demonstrate placement of a RIGHT hip prosthesis without acute fracture or dislocation. IMPRESSION: RIGHT hip arthroplasty without acute complication. Electronically Signed   By: Ulyses Southward M.D.   On: 12/12/2022 18:06   DG Pelvis Portable  Result Date: 12/12/2022 CLINICAL DATA:  Post RIGHT hip surgery EXAM: PORTABLE PELVIS 1-2 VIEWS COMPARISON:  Portable exam 1743 hours compared to intraoperative images of 12/12/2022 FINDINGS: Osseous demineralization. RIGHT hip prosthesis in expected position. No fracture or dislocation. SI joints and LEFT hip joint space preserved. IMPRESSION: RIGHT hip prosthesis without acute complication. Electronically Signed   By: Ulyses Southward  M.D.   On: 12/12/2022 18:05   DG C-Arm 1-60 Min-No Report  Result Date: 12/12/2022 Fluoroscopy was utilized by the requesting physician.  No radiographic interpretation.    Disposition: Discharge disposition: 01-Home or Self Care       Discharge Instructions     Polansky MD / [place] 911   Complete by: As directed    If you experience chest pain or shortness of breath, Greb 911 and be transported to the hospital emergency room.  If you develope a fever above 101 F, pus (white drainage) or increased drainage or redness at the wound, or calf pain, [place]oberg your surgeon's office.   Change dressing   Complete by: As directed    Do not change your dressing.   Constipation Prevention   Complete by: As directed    Drink plenty of fluids.  Prune juice may be helpful.  You may use a stool softener, such as Colace (over  the counter) 100 mg twice a day.  Use MiraLax (over the counter) for constipation as needed.   Diet - low sodium heart healthy   Complete by: As directed    Discharge instructions   Complete by: As directed    Elevate toes above nose. Use cryotherapy as needed for pain and swelling.   Driving restrictions   Complete by: As directed    No driving for 6 weeks   Increase activity slowly as tolerated   Complete by: As directed    Lifting restrictions   Complete by: As directed    No lifting for 6 weeks   Post-operative opioid taper instructions:   Complete by: As directed    POST-OPERATIVE OPIOID TAPER INSTRUCTIONS: It is important to wean off of your opioid medication as soon as possible. If you do not need pain medication after your surgery it is ok to stop day one. Opioids include: Codeine, Hydrocodone(Norco, Vicodin), Oxycodone(Percocet, oxycontin) and hydromorphone amongst others.  Long term and even short term use of opiods can cause: Increased pain response Dependence Constipation Depression Respiratory depression And more.  Withdrawal symptoms can include Flu like symptoms Nausea, vomiting And more Techniques to manage these symptoms Hydrate well Eat regular healthy meals Stay active Use relaxation techniques(deep breathing, meditating, yoga) Do Not substitute Alcohol to help with tapering If you have been on opioids for less than two weeks and do not have pain than it is ok to stop all together.  Plan to wean off of opioids This plan should start within one week post op of your joint replacement. Maintain the same interval or time between taking each dose and first decrease the dose.  Cut the total daily intake of opioids by one tablet each day Next start to increase the time between doses. The last dose that should be eliminated is the evening dose.      TED hose   Complete by: As directed    Use stockings (TED hose) for 2 weeks on both leg(s).  You may remove  them at night for sleeping.   Weight bearing as tolerated   Complete by: As directed           Signed: Clois Dupes, PA-C 12/13/2022, 1:25 PM

## 2022-12-13 NOTE — Progress Notes (Signed)
   12/13/22 1100  Spiritual Encounters  Type of Visit Initial  Care provided to: Patient  Referral source Chaplain assessment  Reason for visit Urgent spiritual support  OnCall Visit No  Spiritual Framework  Presenting Themes Meaning/purpose/sources of inspiration;Values and beliefs;Coping tools;Impactful experiences and emotions  Community/Connection Family;Friend(s)  Patient Stress Factors Health changes  Family Stress Factors Loss of control  Interventions  Spiritual Care Interventions Made Established relationship of care and support;Normalization of emotions;Decision-making support/facilitation;Explored values/beliefs/practices/strengths   Chaplain met with Antonio Gonzalez in room - - sitting up and alert and wanting to talk.  Antonio Gonzalez spoke of his current support system and his family relationships.  Spoke of his support system in place and ready for discharge.  Spoke of his brother who recently died and his emotions regarding such a family loss.  Ended visit with a blessing prayer.

## 2022-12-13 NOTE — Evaluation (Signed)
Physical Therapy Evaluation Patient Details Name: Antonio Gonzalez MRN: [id number] DOB: Jul 19, 1956 Today's Date: 12/13/2022  History of Present Illness  67 yo male presents to therapy s/p R THA, anterior approach on 12/12/2022 due to failure of conservative measures. Pt PMH includes but is not limited to: COPD, COPD, LBP, chronic pain, tobacco abuse and HDL.  Clinical Impression    Antonio Gonzalez is a 67 y.o. male POD 1 s/p R THA, AA. Patient reports Mod I at rollator level with mobility at baseline. Patient is now limited by functional impairments (see PT problem list below) and requires S for bed mobility and min guard and cues for transfers. Patient was able to ambulate 45 feet with RW and min guard level of assist. Patient instructed in exercise to facilitate ROM and circulation to manage edema reviewed and HO provided. PT provided pt and family ed on d/c planning, pt ed provided on fall risk prevention and safety, pt training provided on lower body dressing and recommendation for reacher.  Patient will benefit from continued skilled PT interventions to address impairments and progress towards PLOF. Acute PT will follow to progress mobility and stair training in preparation for safe discharge home with family and social support with HEP.      Recommendations for follow up therapy are one component of a multi-disciplinary discharge planning process, led by the attending physician.  Recommendations may be updated based on patient status, additional functional criteria and insurance authorization.  Follow Up Recommendations       Assistance Recommended at Discharge Intermittent Supervision/Assistance  Patient can return home with the following  A little help with walking and/or transfers;A little help with bathing/dressing/bathroom;Assistance with cooking/housework;Assist for transportation;Help with stairs or ramp for entrance    Equipment Recommendations Rolling walker (2 wheels) (in room at eval and  adjusted)  Recommendations for Other Services       Functional Status Assessment Patient has had a recent decline in their functional status and demonstrates the ability to make significant improvements in function in a reasonable and predictable amount of time.     Precautions / Restrictions Precautions Precautions: Fall Restrictions Weight Bearing Restrictions: No      Mobility  Bed Mobility Overal bed mobility: Needs Assistance Bed Mobility: Supine to Sit, Sit to Supine     Supine to sit: Supervision Sit to supine: Supervision   General bed mobility comments: min cues    Transfers Overall transfer level: Needs assistance Equipment used: Rolling walker (2 wheels) Transfers: Sit to/from Stand Sit to Stand: Min guard           General transfer comment: min cues for proper UE and AD placement    Ambulation/Gait Ambulation/Gait assistance: Min guard Gait Distance (Feet): 45 Feet Assistive device: Rolling walker (2 wheels) Gait Pattern/deviations: Step-to pattern, Antalgic Gait velocity: decreased     General Gait Details: cues for proper distance from RW and extension posture  Stairs            Wheelchair Mobility    Modified Rankin (Stroke Patients Only)       Balance Overall balance assessment: Needs assistance Sitting-balance support: Feet supported Sitting balance-Leahy Scale: Fair     Standing balance support: Bilateral upper extremity supported, Reliant on assistive device for balance, During functional activity (static standing no UE support) Standing balance-Leahy Scale: Fair  Pertinent Vitals/Pain Pain Assessment Pain Assessment: 0-10 Pain Score: 9  Pain Location: R hip back is sore too Pain Descriptors / Indicators: Constant, Cramping, Discomfort, Operative site guarding, Sore Pain Intervention(s): Limited activity within patient's tolerance, Monitored during session, Premedicated before  session, Repositioned, Ice applied (pain medication ~ 45 min prior to eval)    Home Living Family/patient expects to be discharged to:: Private residence Living Arrangements: Non-relatives/Friends Available Help at Discharge: Friend(s) Type of Home: House Home Access: Stairs to enter Entrance Stairs-Rails: Lawyer of Steps: 6   Home Layout: One level Home Equipment: Rollator (4 wheels);Cane - single point      Prior Function Prior Level of Function : Independent/Modified Independent             Mobility Comments: mod I with rollator for all ADLs, self care tasks, IADLs       Hand Dominance        Extremity/Trunk Assessment        Lower Extremity Assessment Lower Extremity Assessment: RLE deficits/detail RLE Deficits / Details: ankle DF/PF 5/5 RLE Sensation: WNL    Cervical / Trunk Assessment Cervical / Trunk Assessment:  (wfl)  Communication   Communication: No difficulties  Cognition Arousal/Alertness: Awake/alert Behavior During Therapy: WFL for tasks assessed/performed Overall Cognitive Status: Within Functional Limits for tasks assessed                                          General Comments General comments (skin integrity, edema, etc.): cues for safety and fall risk prevention    Exercises Total Joint Exercises Ankle Circles/Pumps: AROM, Both, 20 reps Quad Sets: AROM, Right, 10 reps Heel Slides: AROM, Right, 10 reps Hip ABduction/ADduction: AROM, Right, 10 reps, Supine, Standing Long Arc Quad: AROM, Right, 10 reps Marching in Standing: AROM, Right, 10 reps Standing Hip Extension: AROM, Right, 10 reps   Assessment/Plan    PT Assessment Patient needs continued PT services  PT Problem List Decreased strength;Decreased range of motion;Decreased activity tolerance;Decreased balance;Decreased mobility;Decreased coordination;Decreased knowledge of use of DME;Decreased safety awareness;Pain       PT  Treatment Interventions DME instruction;Gait training;Stair training;Functional mobility training;Therapeutic activities;Therapeutic exercise;Balance training;Neuromuscular re-education;Patient/family education;Modalities    PT Goals (Current goals can be found in the Care Plan section)  Acute Rehab PT Goals Patient Stated Goal: go out grocery store, golf, visit friends PT Goal Formulation: With patient Time For Goal Achievement: 12/27/22 Potential to Achieve Goals: Good    Frequency 7X/week     Co-evaluation               AM-PAC PT "6 Clicks" Mobility  Outcome Measure Help needed turning from your back to your side while in a flat bed without using bedrails?: None Help needed moving from lying on your back to sitting on the side of a flat bed without using bedrails?: A Little Help needed moving to and from a bed to a chair (including a wheelchair)?: A Little Help needed standing up from a chair using your arms (e.g., wheelchair or bedside chair)?: A Little Help needed to walk in hospital room?: A Little Help needed climbing 3-5 steps with a railing? : A Lot 6 Click Score: 18    End of Session Equipment Utilized During Treatment: Gait belt Activity Tolerance: Patient tolerated treatment well Patient left: in chair;with [employer] bell/phone within reach Nurse Communication: Mobility status PT Visit Diagnosis:  Unsteadiness on feet (R26.81);Other abnormalities of gait and mobility (R26.89);Muscle weakness (generalized) (M62.81);Pain Pain - Right/Left: Right Pain - part of body: Hip;Leg    Time: 6213-0865 PT Time Calculation (min) (ACUTE ONLY): 46 min   Charges:   PT Evaluation $PT Eval Low Complexity: 1 Low PT Treatments $Gait Training: 8-22 mins $Therapeutic Exercise: 8-22 mins        Rica Mote, PT   Jacqualyn Posey 12/13/2022, 10:45 AM

## 2022-12-14 ENCOUNTER — Encounter (HOSPITAL_COMMUNITY): Payer: Self-pay | Admitting: Orthopedic Surgery

## 2022-12-26 ENCOUNTER — Other Ambulatory Visit (HOSPITAL_COMMUNITY): Payer: Self-pay

## 2022-12-27 ENCOUNTER — Other Ambulatory Visit (HOSPITAL_COMMUNITY): Payer: Self-pay

## 2022-12-28 ENCOUNTER — Other Ambulatory Visit (HOSPITAL_COMMUNITY): Payer: Self-pay

## 2023-01-22 ENCOUNTER — Other Ambulatory Visit (HOSPITAL_COMMUNITY): Payer: Self-pay

## 2023-05-28 ENCOUNTER — Other Ambulatory Visit: Payer: Self-pay

## 2023-05-28 ENCOUNTER — Other Ambulatory Visit (HOSPITAL_COMMUNITY): Payer: Self-pay

## 2023-05-30 ENCOUNTER — Other Ambulatory Visit (HOSPITAL_COMMUNITY): Payer: Self-pay

## 2023-09-08 ENCOUNTER — Other Ambulatory Visit (HOSPITAL_COMMUNITY): Payer: Self-pay
